# Patient Record
Sex: Female | Born: 1970 | Race: White | Hispanic: No | Marital: Married | Smoking: Never smoker
Health system: Southern US, Community
[De-identification: ages and names within clinical notes are randomized; demographics above are authoritative.]

## PROBLEM LIST (undated history)

## (undated) DIAGNOSIS — F32A Depression, unspecified: Secondary | ICD-10-CM

## (undated) DIAGNOSIS — K219 Gastro-esophageal reflux disease without esophagitis: Secondary | ICD-10-CM

## (undated) DIAGNOSIS — F329 Major depressive disorder, single episode, unspecified: Secondary | ICD-10-CM

## (undated) DIAGNOSIS — J45909 Unspecified asthma, uncomplicated: Secondary | ICD-10-CM

## (undated) DIAGNOSIS — N83201 Unspecified ovarian cyst, right side: Secondary | ICD-10-CM

## (undated) DIAGNOSIS — M069 Rheumatoid arthritis, unspecified: Secondary | ICD-10-CM

## (undated) DIAGNOSIS — J302 Other seasonal allergic rhinitis: Secondary | ICD-10-CM

## (undated) DIAGNOSIS — T7840XA Allergy, unspecified, initial encounter: Secondary | ICD-10-CM

## (undated) DIAGNOSIS — N809 Endometriosis, unspecified: Secondary | ICD-10-CM

## (undated) DIAGNOSIS — N83202 Unspecified ovarian cyst, left side: Secondary | ICD-10-CM

## (undated) DIAGNOSIS — D219 Benign neoplasm of connective and other soft tissue, unspecified: Secondary | ICD-10-CM

## (undated) HISTORY — DX: Allergy, unspecified, initial encounter: T78.40XA

## (undated) HISTORY — PX: ABDOMINAL HYSTERECTOMY: SHX81

## (undated) HISTORY — PX: WISDOM TOOTH EXTRACTION: SHX21

## (undated) HISTORY — DX: Unspecified asthma, uncomplicated: J45.909

## (undated) HISTORY — DX: Depression, unspecified: F32.A

## (undated) HISTORY — DX: Unspecified ovarian cyst, right side: N83.201

## (undated) HISTORY — DX: Unspecified ovarian cyst, left side: N83.202

## (undated) HISTORY — DX: Benign neoplasm of connective and other soft tissue, unspecified: D21.9

## (undated) HISTORY — DX: Endometriosis, unspecified: N80.9

## (undated) HISTORY — DX: Major depressive disorder, single episode, unspecified: F32.9

## (undated) HISTORY — DX: Rheumatoid arthritis, unspecified: M06.9

---

## 1999-11-11 ENCOUNTER — Other Ambulatory Visit: Admission: RE | Admit: 1999-11-11 | Discharge: 1999-11-11 | Payer: Self-pay | Admitting: Gynecology

## 2000-11-15 ENCOUNTER — Other Ambulatory Visit: Admission: RE | Admit: 2000-11-15 | Discharge: 2000-11-15 | Payer: Self-pay | Admitting: Obstetrics and Gynecology

## 2002-01-18 ENCOUNTER — Other Ambulatory Visit: Admission: RE | Admit: 2002-01-18 | Discharge: 2002-01-18 | Payer: Self-pay | Admitting: *Deleted

## 2003-01-19 ENCOUNTER — Other Ambulatory Visit: Admission: RE | Admit: 2003-01-19 | Discharge: 2003-01-19 | Payer: Self-pay | Admitting: Gynecology

## 2005-03-17 ENCOUNTER — Other Ambulatory Visit: Admission: RE | Admit: 2005-03-17 | Discharge: 2005-03-17 | Payer: Self-pay | Admitting: Gynecology

## 2012-11-08 ENCOUNTER — Other Ambulatory Visit: Payer: Self-pay | Admitting: Family Medicine

## 2012-11-08 DIAGNOSIS — R51 Headache: Secondary | ICD-10-CM

## 2012-11-09 ENCOUNTER — Ambulatory Visit
Admission: RE | Admit: 2012-11-09 | Discharge: 2012-11-09 | Disposition: A | Discharge status: Home / Self Care | Payer: BC Managed Care – PPO | Source: Ambulatory Visit | Attending: Family Medicine | Admitting: Family Medicine

## 2012-11-09 DIAGNOSIS — R51 Headache: Secondary | ICD-10-CM

## 2013-02-24 ENCOUNTER — Other Ambulatory Visit (HOSPITAL_COMMUNITY)
Admission: RE | Admit: 2013-02-24 | Discharge: 2013-02-24 | Disposition: A | Discharge status: Home / Self Care | Payer: BC Managed Care – PPO | Source: Ambulatory Visit | Attending: Family Medicine | Admitting: Family Medicine

## 2013-02-24 ENCOUNTER — Other Ambulatory Visit: Payer: Self-pay | Admitting: Family Medicine

## 2013-02-24 DIAGNOSIS — Z124 Encounter for screening for malignant neoplasm of cervix: Secondary | ICD-10-CM | POA: Insufficient documentation

## 2013-02-24 DIAGNOSIS — Z1151 Encounter for screening for human papillomavirus (HPV): Secondary | ICD-10-CM | POA: Insufficient documentation

## 2013-06-12 ENCOUNTER — Other Ambulatory Visit: Payer: Self-pay | Admitting: Family Medicine

## 2013-06-12 ENCOUNTER — Ambulatory Visit
Admission: RE | Admit: 2013-06-12 | Discharge: 2013-06-12 | Disposition: A | Discharge status: Home / Self Care | Payer: BC Managed Care – PPO | Source: Ambulatory Visit | Attending: Family Medicine | Admitting: Family Medicine

## 2013-06-12 DIAGNOSIS — M79609 Pain in unspecified limb: Secondary | ICD-10-CM

## 2013-06-22 ENCOUNTER — Encounter (HOSPITAL_COMMUNITY): Payer: Self-pay | Admitting: Emergency Medicine

## 2013-06-22 ENCOUNTER — Emergency Department (HOSPITAL_COMMUNITY)
Admission: EM | Admit: 2013-06-22 | Discharge: 2013-06-23 | Disposition: A | Discharge status: Home / Self Care | Payer: BC Managed Care – PPO | Attending: Emergency Medicine | Admitting: Emergency Medicine

## 2013-06-22 ENCOUNTER — Emergency Department (HOSPITAL_COMMUNITY): Payer: BC Managed Care – PPO

## 2013-06-22 DIAGNOSIS — Z3202 Encounter for pregnancy test, result negative: Secondary | ICD-10-CM | POA: Insufficient documentation

## 2013-06-22 DIAGNOSIS — R102 Pelvic and perineal pain: Secondary | ICD-10-CM

## 2013-06-22 DIAGNOSIS — R110 Nausea: Secondary | ICD-10-CM | POA: Insufficient documentation

## 2013-06-22 DIAGNOSIS — N83209 Unspecified ovarian cyst, unspecified side: Secondary | ICD-10-CM

## 2013-06-22 DIAGNOSIS — N83339 Acquired atrophy of ovary and fallopian tube, unspecified side: Secondary | ICD-10-CM | POA: Insufficient documentation

## 2013-06-22 DIAGNOSIS — N949 Unspecified condition associated with female genital organs and menstrual cycle: Secondary | ICD-10-CM | POA: Insufficient documentation

## 2013-06-22 DIAGNOSIS — Z79899 Other long term (current) drug therapy: Secondary | ICD-10-CM | POA: Insufficient documentation

## 2013-06-22 LAB — URINALYSIS, ROUTINE W REFLEX MICROSCOPIC
Bilirubin Urine: NEGATIVE
Glucose, UA: NEGATIVE mg/dL
Specific Gravity, Urine: 1.016 (ref 1.005–1.030)

## 2013-06-22 LAB — URINE MICROSCOPIC-ADD ON

## 2013-06-22 LAB — WET PREP, GENITAL: Yeast Wet Prep HPF POC: NONE SEEN

## 2013-06-22 MED ORDER — MORPHINE SULFATE 4 MG/ML IJ SOLN
4.0000 mg | Freq: Once | INTRAMUSCULAR | Status: AC
  Administered 2013-06-22: 4 mg via INTRAVENOUS
  Filled 2013-06-22: qty 1

## 2013-06-22 MED ORDER — HYDROCODONE-ACETAMINOPHEN 5-325 MG PO TABS: 1.0000 | ORAL_TABLET | ORAL | Status: DC | PRN

## 2013-06-22 MED ORDER — ONDANSETRON HCL 4 MG/2ML IJ SOLN
4.0000 mg | Freq: Once | INTRAMUSCULAR | Status: AC
  Administered 2013-06-22: 4 mg via INTRAVENOUS
  Filled 2013-06-22: qty 2

## 2013-06-22 NOTE — ED Notes (Signed)
US at bedside

## 2013-06-22 NOTE — ED Provider Notes (Signed)
CSN: [id number]     Arrival date & time 06/22/13  1851 History   First MD Initiated Contact with Patient 06/22/13 1958     Chief Complaint  Patient presents with  . Ovarian Cyst  . ovarian pain    (Consider location/radiation/quality/duration/timing/severity/associated sxs/prior Treatment) HPI Comments: Patient is an otherwise healthy 42 year old female presents to the emergency department for pelvic pain. Patient states she had an ultrasound on Monday for onset of pelvic pain she was diagnosed with multiple ovarian fibroids and an ovarian cyst on her right ovary. Patient also notes that they could not . Patient states she has had worsening pain since Monday. She notes the pain is more severe on the left side and describes it as sharp waxing and waning in nature. She states lying still alleviates the pain. She rates her pain 7/10. Denies any aggravating factors. Patient states she feels nauseous but denies any vomiting, vaginal bleeding or discharge, diarrhea. Patient states her last menstrual cycle was 3 weeks ago.   History reviewed. No pertinent past medical history. History reviewed. No pertinent past surgical history. No family history on file. History  Substance Use Topics  . Smoking status: Never Smoker   . Smokeless tobacco: Not on file  . Alcohol Use: Yes     Comment: ocassional   OB History   Grav Para Term Preterm Abortions TAB SAB Ect Mult Living                 Review of Systems  Constitutional: Negative for fever.  Gastrointestinal: Positive for nausea. Negative for vomiting and diarrhea.  Genitourinary: Positive for pelvic pain. Negative for vaginal bleeding and vaginal discharge.  All other systems reviewed and are negative.    Allergies  Sulfa antibiotics  Home Medications   Current Outpatient Rx  Name  Route  Sig  Dispense  Refill  . albuterol (PROVENTIL HFA;VENTOLIN HFA) 108 (90 BASE) MCG/ACT inhaler   Inhalation   Inhale 2 puffs into the lungs every 6  (six) hours as needed for wheezing.         . cetirizine (ZYRTEC) 10 MG tablet   Oral   Take 10 mg by mouth daily.         . cyclobenzaprine (FLEXERIL) 10 MG tablet   Oral   Take 10 mg by mouth 3 (three) times daily as needed for muscle spasms.         . fluticasone (FLONASE) 50 MCG/ACT nasal spray   Nasal   Place 2 sprays into the nose daily.         . sertraline (ZOLOFT) 100 MG tablet   Oral   Take 100 mg by mouth every morning.         Marland Kitchen HYDROcodone-acetaminophen (NORCO/VICODIN) 5-325 MG per tablet   Oral   Take 1 tablet by mouth every 4 (four) hours as needed for pain.   12 tablet   0    BP 129/61  Pulse 87  Temp(Src) 98.6 F (37 C) (Oral)  Resp 20  SpO2 99%  LMP 06/08/2013 Physical Exam  Constitutional: She is oriented to person, place, and time. She appears well-developed and well-nourished. No distress.  HENT:  Head: Normocephalic and atraumatic.  Right Ear: External ear normal.  Left Ear: External ear normal.  Nose: Nose normal.  Mouth/Throat: Oropharynx is clear and moist.  Eyes: Conjunctivae are normal.  Neck: Neck supple.  Cardiovascular: Normal rate, regular rhythm, normal heart sounds and intact distal pulses.   Pulmonary/Chest: Effort  normal and breath sounds normal.  Abdominal: Soft. Bowel sounds are normal. She exhibits no distension. There is no tenderness. There is no rebound and no guarding.  Musculoskeletal: She exhibits no edema and no tenderness.  Neurological: She is alert and oriented to person, place, and time.  Skin: Skin is warm and dry. She is not diaphoretic.   Exam performed by Francee Piccolo L,  exam chaperoned Date: 06/22/2013 Pelvic exam: normal external genitalia without evidence of trauma. VULVA: normal appearing vulva with no masses, tenderness or lesion. VAGINA: normal appearing vagina with normal color and discharge, no lesions. CERVIX: normal appearing cervix without lesions, cervical motion tenderness  absent, cervical os closed with out purulent discharge; vaginal discharge - clear, Wet prep and DNA probe for chlamydia and GC obtained.   ADNEXA: normal adnexa in size, tender and no masses UTERUS: uterus is normal size, shape, consistency and nontender.    ED Course  Procedures (including critical care time) Labs Review Labs Reviewed  WET PREP, GENITAL - Abnormal; Notable for the following:    WBC, Wet Prep HPF POC RARE (*)    All other components within normal limits  URINALYSIS, ROUTINE W REFLEX MICROSCOPIC - Abnormal; Notable for the following:    Hgb urine dipstick TRACE (*)    All other components within normal limits  URINE MICROSCOPIC-ADD ON - Abnormal; Notable for the following:    Bacteria, UA FEW (*)    All other components within normal limits  GC/CHLAMYDIA PROBE AMP  PREGNANCY, URINE   Imaging Review US Transvaginal Non-ob  06/22/2013   CLINICAL DATA:  Pelvic pain. Evaluate for potential ovarian torsion.  EXAM: TRANSABDOMINAL AND TRANSVAGINAL ULTRASOUND OF PELVIS  DOPPLER ULTRASOUND OF OVARIES  TECHNIQUE: Both transabdominal and transvaginal ultrasound examinations of the pelvis were performed. Transabdominal technique was performed for global imaging of the pelvis including uterus, ovaries, adnexal regions, and pelvic cul-de-sac.  It was necessary to proceed with endovaginal exam following the transabdominal exam to visualize the ovaries. Color and duplex Doppler ultrasound was utilized to evaluate blood flow to the ovaries.  COMPARISON:  06/19/2013.  FINDINGS: Uterus  Measurements: 9.9 x 5.5 x 6.6 cm. Multiple small heterogeneously isoechoic to hypoechoic lesions, compatible with multifocal fibroids. The largest of these is in the anterior aspect of the uterine body on the left side measuring 4.7 x 3.1 x 4.4 cm. Several of these lesions have internal areas that are very echogenic with posterior acoustic shadowing, likely to represent internal calcifications within the fibroids.   Endometrium  Endometrium is completely obscured by fibroids.  Right ovary  Measurements: 11.7 x 5.9 x 7.6 cm. Larger lesion measuring approximately 9.3 x 4.9 x 6.4 cm which is anechoic with increased through transmission, compatible with a cyst. It is uncertain whether not there is mural nodularity, or if this is simply just compression of the cyst wall and adjacent normal ovarian tissue.  Left ovary  Measurements: 4.9 x 3.1 x 3.9 cm. 3.3 x 2.9 x 2.9 cm anechoic lesion with increased through transmission, compatible with a simple cyst.  Pulsed Doppler evaluation of both ovaries demonstrates normal low-resistance arterial and venous waveforms.  IMPRESSION: 1. No findings to suggest ovarian torsion. 2. Fibroid uterus. 3. Large cyst in the right ovary measuring 9.3 x 4.9 x 6.4 cm. This generally appears simple, however, there is at least one area of potential mural nodularity (favored to represent some adjacent normal ovarian tissue). However, given the size of this cyst and potential complexity, further evaluation with  nonemergent pelvic MRI is recommended at this time to exclude potential neoplasm.   Electronically Signed   By: Trudie Reed M.D.   On: 06/22/2013 22:05   US Pelvis Complete  06/22/2013   CLINICAL DATA:  Pelvic pain. Evaluate for potential ovarian torsion.  EXAM: TRANSABDOMINAL AND TRANSVAGINAL ULTRASOUND OF PELVIS  DOPPLER ULTRASOUND OF OVARIES  TECHNIQUE: Both transabdominal and transvaginal ultrasound examinations of the pelvis were performed. Transabdominal technique was performed for global imaging of the pelvis including uterus, ovaries, adnexal regions, and pelvic cul-de-sac.  It was necessary to proceed with endovaginal exam following the transabdominal exam to visualize the ovaries. Color and duplex Doppler ultrasound was utilized to evaluate blood flow to the ovaries.  COMPARISON:  06/19/2013.  FINDINGS: Uterus  Measurements: 9.9 x 5.5 x 6.6 cm. Multiple small heterogeneously isoechoic  to hypoechoic lesions, compatible with multifocal fibroids. The largest of these is in the anterior aspect of the uterine body on the left side measuring 4.7 x 3.1 x 4.4 cm. Several of these lesions have internal areas that are very echogenic with posterior acoustic shadowing, likely to represent internal calcifications within the fibroids.  Endometrium  Endometrium is completely obscured by fibroids.  Right ovary  Measurements: 11.7 x 5.9 x 7.6 cm. Larger lesion measuring approximately 9.3 x 4.9 x 6.4 cm which is anechoic with increased through transmission, compatible with a cyst. It is uncertain whether not there is mural nodularity, or if this is simply just compression of the cyst wall and adjacent normal ovarian tissue.  Left ovary  Measurements: 4.9 x 3.1 x 3.9 cm. 3.3 x 2.9 x 2.9 cm anechoic lesion with increased through transmission, compatible with a simple cyst.  Pulsed Doppler evaluation of both ovaries demonstrates normal low-resistance arterial and venous waveforms.  IMPRESSION: 1. No findings to suggest ovarian torsion. 2. Fibroid uterus. 3. Large cyst in the right ovary measuring 9.3 x 4.9 x 6.4 cm. This generally appears simple, however, there is at least one area of potential mural nodularity (favored to represent some adjacent normal ovarian tissue). However, given the size of this cyst and potential complexity, further evaluation with nonemergent pelvic MRI is recommended at this time to exclude potential neoplasm.   Electronically Signed   By: Trudie Reed M.D.   On: 06/22/2013 22:05   [place] Art/ven Flow Abd Pelv Doppler  06/22/2013   CLINICAL DATA:  Pelvic pain. Evaluate for potential ovarian torsion.  EXAM: TRANSABDOMINAL AND TRANSVAGINAL ULTRASOUND OF PELVIS  DOPPLER ULTRASOUND OF OVARIES  TECHNIQUE: Both transabdominal and transvaginal ultrasound examinations of the pelvis were performed. Transabdominal technique was performed for global imaging of the pelvis including uterus, ovaries,  adnexal regions, and pelvic cul-de-sac.  It was necessary to proceed with endovaginal exam following the transabdominal exam to visualize the ovaries. Color and duplex Doppler ultrasound was utilized to evaluate blood flow to the ovaries.  COMPARISON:  06/19/2013.  FINDINGS: Uterus  Measurements: 9.9 x 5.5 x 6.6 cm. Multiple small heterogeneously isoechoic to hypoechoic lesions, compatible with multifocal fibroids. The largest of these is in the anterior aspect of the uterine body on the left side measuring 4.7 x 3.1 x 4.4 cm. Several of these lesions have internal areas that are very echogenic with posterior acoustic shadowing, likely to represent internal calcifications within the fibroids.  Endometrium  Endometrium is completely obscured by fibroids.  Right ovary  Measurements: 11.7 x 5.9 x 7.6 cm. Larger lesion measuring approximately 9.3 x 4.9 x 6.4 cm which is anechoic with increased  through transmission, compatible with a cyst. It is uncertain whether not there is mural nodularity, or if this is simply just compression of the cyst wall and adjacent normal ovarian tissue.  Left ovary  Measurements: 4.9 x 3.1 x 3.9 cm. 3.3 x 2.9 x 2.9 cm anechoic lesion with increased through transmission, compatible with a simple cyst.  Pulsed Doppler evaluation of both ovaries demonstrates normal low-resistance arterial and venous waveforms.  IMPRESSION: 1. No findings to suggest ovarian torsion. 2. Fibroid uterus. 3. Large cyst in the right ovary measuring 9.3 x 4.9 x 6.4 cm. This generally appears simple, however, there is at least one area of potential mural nodularity (favored to represent some adjacent normal ovarian tissue). However, given the size of this cyst and potential complexity, further evaluation with nonemergent pelvic MRI is recommended at this time to exclude potential neoplasm.   Electronically Signed   By: Trudie Reed M.D.   On: 06/22/2013 22:05    MDM   1. Ovarian cyst   2. Pelvic pain       Afebrile, NAD, non-toxic appearing, AAOx4. Abdomen soft nontender nondistended with normal bowel sounds. Pelvic exam revealed bilateral adnexal tenderness without cervical motion tenderness likely attributed to numerous fibroids in uterus along with large right ovarian cyst. I have reviewed nursing notes, vital signs, and all appropriate lab and imaging results for this patient. Discussed ultrasound findings with patient. Provided printout of radiology read of ultrasound. Discussed the need to followup with her primary care physician tomorrow to schedule followup MRI. Advised patient to move up her gynecology appointment sooner than October 1. Pain and symptoms managed in the ED. Return precautions discussed. Patient agreeable to plan. Pain medication indicated. Patient stable at time of discharge. Patient d/w with Dr. Fayrene Fearing, agrees with plan.        Jeannetta Ellis, PA-C 06/23/13 [phone]

## 2013-06-22 NOTE — ED Notes (Signed)
Pt states she has ovarian cyst and fibroids, had ultrasound on Monday. States pain has gotten worse over the week. Pt spoke to PCP and was told to come here.

## 2013-06-25 NOTE — ED Provider Notes (Signed)
Medical screening examination/treatment/procedure(s) were performed by non-physician practitioner and as supervising physician I was immediately available for consultation/collaboration.   Roney Marion, MD 06/25/13 [phone]

## 2013-06-26 ENCOUNTER — Encounter: Payer: Self-pay | Admitting: Gynecology

## 2013-06-26 ENCOUNTER — Ambulatory Visit (INDEPENDENT_AMBULATORY_CARE_PROVIDER_SITE_OTHER): Payer: BC Managed Care – PPO | Admitting: Gynecology

## 2013-06-26 VITALS — BP 134/80 | HR 88 | Resp 16 | Ht 68.0 in | Wt 253.0 lb

## 2013-06-26 DIAGNOSIS — D259 Leiomyoma of uterus, unspecified: Secondary | ICD-10-CM

## 2013-06-26 DIAGNOSIS — N83209 Unspecified ovarian cyst, unspecified side: Secondary | ICD-10-CM

## 2013-06-26 DIAGNOSIS — N83299 Other ovarian cyst, unspecified side: Secondary | ICD-10-CM | POA: Insufficient documentation

## 2013-06-26 DIAGNOSIS — R102 Pelvic and perineal pain: Secondary | ICD-10-CM

## 2013-06-26 DIAGNOSIS — N949 Unspecified condition associated with female genital organs and menstrual cycle: Secondary | ICD-10-CM

## 2013-06-26 MED ORDER — HYDROCODONE-ACETAMINOPHEN 5-325 MG PO TABS: 1.0000 | ORAL_TABLET | ORAL | Status: DC | PRN

## 2013-06-26 NOTE — Progress Notes (Signed)
42 y.o. Partnered Caucasian female   G0P0000 here for annual exam. Pt is currently sexually active.  Pt reports pelvic pain for 1-2y but had gotten much wrose over the last 21m.  described as dull ache and pressure-abdominal.  Pt denies weight loss.  Pt had u/s done after PMD noted that her uterus felt an enlarged uterus, pt had u/s done 8/16 and was noted to have multiple fibroids and bilateral ovarian cysts, one complex, no torsion.  Pt had not had a pelvic exam in 7y due to loss of insurance.  No pain with vaginal penetration  Patient's last menstrual period was 06/04/2013.          Sexually active: no  The current method of family planning is not indicated  Exercising: yes  The patient does not participate in regular exercise at present. Last pap: 4/14- Negative Alcohol: occasionally  Tobacco: no BSE: no Mammogram:  none  Hgb: PCP ; Urine: PCP  No health maintenance topics applied.  Family History  Problem Relation Age of Onset  . Hypertension Mother   . Diabetes Father     Type 2  . Osteoporosis Maternal Grandmother   . Colon cancer Paternal Grandfather     There are no active problems to display for this patient.   Past Medical History  Diagnosis Date  . Depression   . Fibroid     Past Surgical History  Procedure Laterality Date  . Wisdom tooth extraction  30 years ago    Allergies: Sulfa antibiotics  Current Outpatient Prescriptions  Medication Sig Dispense Refill  . cetirizine (ZYRTEC) 10 MG tablet Take 10 mg by mouth daily.      . cyclobenzaprine (FLEXERIL) 10 MG tablet Take 10 mg by mouth 3 (three) times daily as needed for muscle spasms.      . fluticasone (FLONASE) 50 MCG/ACT nasal spray Place 2 sprays into the nose daily.      Marland Kitchen HYDROcodone-acetaminophen (NORCO/VICODIN) 5-325 MG per tablet Take 1 tablet by mouth every 4 (four) hours as needed for pain.  12 tablet  0  . sertraline (ZOLOFT) 100 MG tablet Take 100 mg by mouth every morning.      Marland Kitchen albuterol  (PROVENTIL HFA;VENTOLIN HFA) 108 (90 BASE) MCG/ACT inhaler Inhale 2 puffs into the lungs every 6 (six) hours as needed for wheezing.       No current facility-administered medications for this visit.    ROS: Pertinent items are noted in HPI.  Exam:    Ht 5\' 8"  (1.727 m)  Wt 253 lb (114.76 kg)  BMI 38.48 kg/m2  LMP 06/04/2013 Weight change: @WEIGHTCHANGE @ Last 3 height recordings:  Ht Readings from Last 3 Encounters:  06/26/13 5\' 8"  (1.727 m)   General appearance: alert, cooperative and appears stated age Head: Normocephalic, without obvious abnormality, atraumatic Neck: no adenopathy, no carotid bruit, no JVD, supple, symmetrical, trachea midline and thyroid not enlarged, symmetric, no tenderness/mass/nodules Lungs: clear to auscultation bilaterally Breasts: normal appearance, no masses or tenderness Heart: regular rate and rhythm, S1, S2 normal, no murmur, click, rub or gallop Abdomen: soft, non-tender; bowel sounds normal; no masses,  no organomegaly Extremities: extremities normal, atraumatic, no cyanosis or edema Skin: Skin color, texture, turgor normal. No rashes or lesions Lymph nodes: Cervical, supraclavicular, and axillary nodes normal. no inguinal nodes palpated Neurologic: Grossly normal    Pelvic: External genitalia:  no lesions              Urethra: normal appearing urethra with no  masses, tenderness or lesions              Bartholins and Skenes: Bartholin's, Urethra, Skene's normal                 Vagina: normal appearing vagina with normal color and discharge, no lesions              Cervix: normal appearance              Pap taken: no        Bimanual Exam:  Uterus:  uterus is normal size, shape, consistency and nontender, enlarged to 10 week's size, irregular                                      Adnexa:    Gentle exam due to known bilateral complex masses                                      Rectovaginal: Confirms, minimal rectal mass                                       Anus:  normal sphincter tone, no lesions  A: fibroid uterus Complex adnexal mass Pain management mammogram     P: reviewed images of u/s will get ca125 Will refill vicoden for now Pt will get screening mammogram    An After Visit Summary was printed and given to the patient.

## 2013-06-26 NOTE — Patient Instructions (Signed)
Start colace 100mg  3x/d

## 2013-06-27 ENCOUNTER — Other Ambulatory Visit: Payer: Self-pay

## 2013-06-27 ENCOUNTER — Other Ambulatory Visit: Payer: Self-pay | Admitting: Gynecology

## 2013-06-27 DIAGNOSIS — Z1231 Encounter for screening mammogram for malignant neoplasm of breast: Secondary | ICD-10-CM

## 2013-06-28 ENCOUNTER — Telehealth: Payer: Self-pay | Admitting: *Deleted

## 2013-06-28 ENCOUNTER — Ambulatory Visit
Admission: RE | Admit: 2013-06-28 | Discharge: 2013-06-28 | Disposition: A | Discharge status: Home / Self Care | Payer: Self-pay | Source: Ambulatory Visit

## 2013-06-28 DIAGNOSIS — Z1231 Encounter for screening mammogram for malignant neoplasm of breast: Secondary | ICD-10-CM

## 2013-06-28 NOTE — Telephone Encounter (Signed)
Message copied by Alisa Graff on Wed Jun 28, 2013  5:05 PM ------      Message from: Douglass Rivers      Created: Wed Jun 28, 2013  2:55 PM       Please inform minimally elevated which is consistent with this being endometriomas ------

## 2013-06-28 NOTE — Telephone Encounter (Signed)
Patient notified of results as Dr Farrel Gobble instructed.

## 2013-06-29 ENCOUNTER — Telehealth: Payer: Self-pay | Admitting: Gynecology

## 2013-06-29 NOTE — Telephone Encounter (Signed)
Spoke with patient in regards to her OOP for surgery. Explained that we are filing through her insurance company first then she will receive a bill. **We are holding charges until the end of Oct. [facility] fees should fulfill patients OOP maximum.

## 2013-06-29 NOTE — Telephone Encounter (Signed)
Patient is asking for OOP cost for upcoming surgery.

## 2013-06-30 ENCOUNTER — Encounter (HOSPITAL_COMMUNITY): Payer: Self-pay | Admitting: Pharmacist

## 2013-07-03 ENCOUNTER — Other Ambulatory Visit: Payer: Self-pay

## 2013-07-03 ENCOUNTER — Encounter (HOSPITAL_COMMUNITY)
Admission: RE | Admit: 2013-07-03 | Discharge: 2013-07-03 | Disposition: A | Discharge status: Home / Self Care | Payer: BC Managed Care – PPO | Source: Ambulatory Visit | Attending: Gynecology | Admitting: Gynecology

## 2013-07-03 ENCOUNTER — Other Ambulatory Visit: Payer: Self-pay | Admitting: Gynecology

## 2013-07-03 ENCOUNTER — Encounter (HOSPITAL_COMMUNITY): Payer: Self-pay

## 2013-07-03 DIAGNOSIS — Z01818 Encounter for other preprocedural examination: Secondary | ICD-10-CM | POA: Insufficient documentation

## 2013-07-03 DIAGNOSIS — R928 Other abnormal and inconclusive findings on diagnostic imaging of breast: Secondary | ICD-10-CM

## 2013-07-03 DIAGNOSIS — Z01812 Encounter for preprocedural laboratory examination: Secondary | ICD-10-CM | POA: Insufficient documentation

## 2013-07-03 DIAGNOSIS — N63 Unspecified lump in unspecified breast: Secondary | ICD-10-CM

## 2013-07-03 HISTORY — DX: Other seasonal allergic rhinitis: J30.2

## 2013-07-03 HISTORY — DX: Gastro-esophageal reflux disease without esophagitis: K21.9

## 2013-07-03 LAB — CBC
HCT: 41.2 % (ref 36.0–46.0)
Hemoglobin: 14.1 g/dL (ref 12.0–15.0)
MCV: 85.5 fL (ref 78.0–100.0)
Platelets: 280 10*3/uL (ref 150–400)
RBC: 4.82 MIL/uL (ref 3.87–5.11)
WBC: 10.8 10*3/uL — ABNORMAL HIGH (ref 4.0–10.5)

## 2013-07-03 NOTE — Patient Instructions (Addendum)
   Your procedure is scheduled on: Monday, Oct. 13  Enter through the Main Entrance of [facility] at: 6 am Pick up the phone at the desk and dial [phone] and inform us of your arrival.  Please call this number if you have any problems the morning of surgery: [phone]  Remember: Do not eat or drink after midnight: Sunday Take these medicines the morning of surgery with a SIP OF WATER:  Zyrtec, zoloft, and bring albuterol inhaler   Do not wear jewelry, make-up, or FINGER nail polish No metal in your hair or on your body. Do not wear lotions, powders, perfumes. You may wear deodorant.  Please use your CHG wash as directed prior to surgery.  Do not shave anywhere for at least 12 hours prior to first CHG shower.  Do not bring valuables to the hospital. Contacts, dentures or bridgework may not be worn into surgery.  Leave suitcase in the car. After Surgery it may be brought to your room. For patients being admitted to the hospital, checkout time is 11:00am the day of discharge.  Patients discharged on the day of surgery will not be allowed to drive home.  Home with mother Rendy Lazard or [facility].

## 2013-07-04 ENCOUNTER — Telehealth: Payer: Self-pay | Admitting: Orthopedic Surgery

## 2013-07-04 ENCOUNTER — Telehealth: Payer: Self-pay | Admitting: Gynecology

## 2013-07-04 NOTE — Telephone Encounter (Signed)
Spoke with pt re: pre op instructions per sheet. Scheduled pre op and both post op appts with TL. Mailed instruction sheet to pt. Questions answered.

## 2013-07-05 ENCOUNTER — Encounter: Payer: Self-pay | Admitting: Gynecology

## 2013-07-05 ENCOUNTER — Ambulatory Visit (INDEPENDENT_AMBULATORY_CARE_PROVIDER_SITE_OTHER): Payer: BC Managed Care – PPO | Admitting: Gynecology

## 2013-07-05 VITALS — BP 112/62 | HR 68 | Resp 12 | Ht 68.0 in | Wt 254.0 lb

## 2013-07-05 DIAGNOSIS — N83299 Other ovarian cyst, unspecified side: Secondary | ICD-10-CM

## 2013-07-05 DIAGNOSIS — D259 Leiomyoma of uterus, unspecified: Secondary | ICD-10-CM

## 2013-07-05 DIAGNOSIS — N83209 Unspecified ovarian cyst, unspecified side: Secondary | ICD-10-CM

## 2013-07-05 MED ORDER — CELECOXIB 200 MG PO CAPS: ORAL_CAPSULE | ORAL | Status: DC

## 2013-07-05 MED ORDER — HYDROCODONE-ACETAMINOPHEN 5-325 MG PO TABS: 1.0000 | ORAL_TABLET | ORAL | Status: DC | PRN

## 2013-07-05 MED ORDER — HYDROMORPHONE HCL 2 MG PO TABS: 2.0000 mg | ORAL_TABLET | ORAL | Status: DC | PRN

## 2013-07-05 NOTE — Patient Instructions (Signed)
Take 2 tablets 400mg - celebrex the morning of OR then take one every 12h for the next 3d May mix with dilaudid 1-2 every 4h as needed for pain

## 2013-07-05 NOTE — Progress Notes (Signed)
42 y.o.SingleCaucasian G0P0000 female here for consideration for robotically assisted TLH  with Bilateral salpingectomy and with removal of ovaries.    She complains of uterine leiomyomata/fibroids and bilateral endometriomas. PUS on 06/22/13 showed uterus 9.9 x 5.5 x 6.6 cm. Multiple small heterogeneously  isoechoic to hypoechoic lesions, compatible with multifocal  fibroids. The largest of these is in the anterior aspect of the  uterine body on the left side measuring 4.7 x 3.1 x 4.4 cm..  Large cyst in the right ovary measuring 9.3 x 4.9 x 6.4 cm. This generally appears simple, however, there is at least one area of potential mural nodularity    The current method of family planning is none.     Hormones:none   reports that she has never smoked. She has never used smokeless tobacco. She reports that  drinks alcohol. She reports that she does not use illicit drugs.  @CHLEC1NM @  Health Maintenance  Topic Date Due  . Tetanus/tdap  04/16/1988  . Influenza Vaccine  05/05/2013  . Pap Smear  03/03/2016    Family History  Problem Relation Age of Onset  . Hypertension Mother   . Diabetes Father     Type 2  . Osteoporosis Maternal Grandmother   . Colon cancer Paternal Grandfather     Patient Active Problem List   Diagnosis Date Noted  . Complex ovarian cyst 06/26/2013  . Fibroid uterus 06/26/2013    Past Medical History  Diagnosis Date  . Depression   . Fibroid   . Ovarian cyst, bilateral   . Asthma     seasonal  . Seasonal allergies   . GERD (gastroesophageal reflux disease)     diet controlled and otc med prn    Past Surgical History  Procedure Laterality Date  . Wisdom tooth extraction  30 years ago    Allergies: @RRALLERGIES @  Current Outpatient Prescriptions  Medication Sig Dispense Refill  . albuterol (PROVENTIL HFA;VENTOLIN HFA) 108 (90 BASE) MCG/ACT inhaler Inhale 2 puffs into the lungs every 6 (six) hours as needed for wheezing.      . cetirizine (ZYRTEC) 10  MG tablet Take 10 mg by mouth daily.      . fluticasone (FLONASE) 50 MCG/ACT nasal spray Place 2 sprays into the nose daily.      Marland Kitchen HYDROcodone-acetaminophen (NORCO/VICODIN) 5-325 MG per tablet Take 1 tablet by mouth every 4 (four) hours as needed for pain.  20 tablet  0  . sertraline (ZOLOFT) 100 MG tablet Take 100 mg by mouth every morning.      . celecoxib (CELEBREX) 200 MG capsule Take 2 tablets am of surgery then 1 by mouth twice a day  6 capsule  0  . HYDROmorphone (DILAUDID) 2 MG tablet Take 1 tablet (2 mg total) by mouth every 4 (four) hours as needed for pain.  20 tablet  0   No current facility-administered medications for this visit.      Exam:    BP 112/62  Pulse 68  Resp 12  Ht 5\' 8"  (1.727 m)  Wt 254 lb (115.214 kg)  BMI 38.63 kg/m2  LMP 07/01/2013  General appearance: alert, cooperative and appears stated age Head: Normocephalic, without obvious abnormality, atraumatic Neck: no adenopathy, no carotid bruit, no JVD, supple, symmetrical, trachea midline and thyroid not enlarged, symmetric, no tenderness/mass/nodules Lungs: clear to auscultation bilaterally Heart: regular rate and rhythm, S1, S2 normal, no murmur, click, rub or gallop Abdomen: soft, non-tender; bowel sounds normal; no masses,  no organomegaly Extremities:  extremities normal, atraumatic, no cyanosis or edema Lymph nodes: Cervical, supraclavicular, and axillary nodes normal. no inguinal nodes palpated Neurologic: Grossly normal    Pelvic: deferred                    A/P:  The planned procedure was discussed with the patient.  Pre-op instructions, hospital course, and post-op instructions reviewed.  Post op instruction booklet given and reviewed.  Risks and possible complications discussed, including but not limited to, bleeding and possible transfusion; infection; anesthesia complications; injury to visceral organ requiring further surgery, either immediately or delayed; wound complications including  infection, blood collections, and bowel herniation; allergic reactions; swelling of the face due to prolonged positioning;  VTE or air emboli; nerve injury related to prolonged positioning; even death.    The patient was given the opportunity to watch the informed consent video on hysterectomy.  Her questions were invited and answered.  The patient states she understands the risks and possible complications, and wishes to proceed as planned.  Consent form signed.  She was instructed to take two 200 mg celebrex capsules with a small sip of water the morning of the procedure, and then to take one bid on POD 1 and 2.  Rx given for vicoden 5/325 for now and dilaudid 2mg  for afterwards to take one or two po q4-6 hours prn post op pain.    Ready for surgery.

## 2013-07-06 NOTE — Telephone Encounter (Signed)
Patient states pharmacy did not get rx for vicodin or dilaudid. Please advise?  [place] [employer]

## 2013-07-07 ENCOUNTER — Other Ambulatory Visit: Payer: Self-pay | Admitting: Gynecology

## 2013-07-07 ENCOUNTER — Other Ambulatory Visit: Payer: Self-pay | Admitting: Obstetrics and Gynecology

## 2013-07-07 DIAGNOSIS — R928 Other abnormal and inconclusive findings on diagnostic imaging of breast: Secondary | ICD-10-CM

## 2013-07-07 NOTE — Telephone Encounter (Signed)
S/w patient rx's were printed out but not given to her. Printed rx's are in a envelope and sent to the front; patient to come by and pick up today.

## 2013-07-14 ENCOUNTER — Ambulatory Visit
Admission: RE | Admit: 2013-07-14 | Discharge: 2013-07-14 | Disposition: A | Discharge status: Home / Self Care | Payer: BC Managed Care – PPO | Source: Ambulatory Visit | Attending: Gynecology | Admitting: Gynecology

## 2013-07-14 ENCOUNTER — Telehealth: Payer: Self-pay | Admitting: Gynecology

## 2013-07-14 DIAGNOSIS — R928 Other abnormal and inconclusive findings on diagnostic imaging of breast: Secondary | ICD-10-CM

## 2013-07-14 NOTE — Telephone Encounter (Signed)
Patient went to pick up medications for surgery Monday. They wouldn't fill the celebrex because they said it required doctor authorization or something.

## 2013-07-14 NOTE — Telephone Encounter (Signed)
Spoke with [employer]. They require prior authorization. Advised by bcbs Vernona Rieger P) it would take 72 hours for authorization to be completed.  Patient has surgery on Monday.  I spoke with patient and advised she could pay cash for rx (6 pills only) and use coupon from [email or web address]

## 2013-07-16 MED ORDER — DEXTROSE 5 % IV SOLN
2.0000 g | INTRAVENOUS | Status: AC
  Administered 2013-07-17: 2 g via INTRAVENOUS
  Filled 2013-07-16: qty 2

## 2013-07-17 ENCOUNTER — Encounter (HOSPITAL_COMMUNITY): Payer: BC Managed Care – PPO | Admitting: Anesthesiology

## 2013-07-17 ENCOUNTER — Ambulatory Visit (HOSPITAL_COMMUNITY)
Admission: RE | Admit: 2013-07-17 | Discharge: 2013-07-18 | Disposition: A | Discharge status: Home / Self Care | Payer: BC Managed Care – PPO | Source: Ambulatory Visit | Attending: Gynecology | Admitting: Gynecology

## 2013-07-17 ENCOUNTER — Encounter (HOSPITAL_COMMUNITY): Payer: Self-pay | Admitting: *Deleted

## 2013-07-17 ENCOUNTER — Encounter (HOSPITAL_COMMUNITY)
Admission: RE | Disposition: A | Discharge status: Home / Self Care | Payer: Self-pay | Source: Ambulatory Visit | Attending: Gynecology

## 2013-07-17 ENCOUNTER — Ambulatory Visit (HOSPITAL_COMMUNITY): Payer: BC Managed Care – PPO | Admitting: Anesthesiology

## 2013-07-17 DIAGNOSIS — D259 Leiomyoma of uterus, unspecified: Secondary | ICD-10-CM

## 2013-07-17 DIAGNOSIS — Z9889 Other specified postprocedural states: Secondary | ICD-10-CM

## 2013-07-17 DIAGNOSIS — N8000 Endometriosis of the uterus, unspecified: Secondary | ICD-10-CM

## 2013-07-17 DIAGNOSIS — N80109 Endometriosis of ovary, unspecified side, unspecified depth: Secondary | ICD-10-CM | POA: Insufficient documentation

## 2013-07-17 DIAGNOSIS — N803 Endometriosis of pelvic peritoneum, unspecified: Secondary | ICD-10-CM | POA: Insufficient documentation

## 2013-07-17 DIAGNOSIS — N949 Unspecified condition associated with female genital organs and menstrual cycle: Secondary | ICD-10-CM | POA: Insufficient documentation

## 2013-07-17 DIAGNOSIS — N8300 Follicular cyst of ovary, unspecified side: Secondary | ICD-10-CM | POA: Insufficient documentation

## 2013-07-17 DIAGNOSIS — N852 Hypertrophy of uterus: Secondary | ICD-10-CM | POA: Insufficient documentation

## 2013-07-17 DIAGNOSIS — N9489 Other specified conditions associated with female genital organs and menstrual cycle: Secondary | ICD-10-CM

## 2013-07-17 DIAGNOSIS — D251 Intramural leiomyoma of uterus: Secondary | ICD-10-CM | POA: Insufficient documentation

## 2013-07-17 DIAGNOSIS — N831 Corpus luteum cyst of ovary, unspecified side: Secondary | ICD-10-CM | POA: Insufficient documentation

## 2013-07-17 DIAGNOSIS — N801 Endometriosis of ovary: Secondary | ICD-10-CM | POA: Insufficient documentation

## 2013-07-17 HISTORY — PX: ROBOTIC ASSISTED TOTAL HYSTERECTOMY: SHX6085

## 2013-07-17 HISTORY — PX: ROBOTIC ASSISTED BILATERAL SALPINGO OOPHERECTOMY: SHX6078

## 2013-07-17 HISTORY — PX: CYSTOSCOPY: SHX5120

## 2013-07-17 LAB — TYPE AND SCREEN: ABO/RH(D): O NEG

## 2013-07-17 SURGERY — ROBOTIC ASSISTED TOTAL HYSTERECTOMY
Anesthesia: General | Site: Bladder | Wound class: Clean Contaminated

## 2013-07-17 MED ORDER — LIDOCAINE HCL (CARDIAC) 20 MG/ML IV SOLN
INTRAVENOUS | Status: AC
  Filled 2013-07-17: qty 5

## 2013-07-17 MED ORDER — ROCURONIUM BROMIDE 100 MG/10ML IV SOLN
INTRAVENOUS | Status: DC | PRN
  Administered 2013-07-17: 20 mg via INTRAVENOUS
  Administered 2013-07-17: 10 mg via INTRAVENOUS
  Administered 2013-07-17: 60 mg via INTRAVENOUS

## 2013-07-17 MED ORDER — PROPOFOL 10 MG/ML IV BOLUS
INTRAVENOUS | Status: DC | PRN
  Administered 2013-07-17: 200 mg via INTRAVENOUS

## 2013-07-17 MED ORDER — LACTATED RINGERS IV SOLN: INTRAVENOUS | Status: DC

## 2013-07-17 MED ORDER — HYDROMORPHONE HCL PF 1 MG/ML IJ SOLN
INTRAMUSCULAR | Status: AC
  Filled 2013-07-17: qty 1

## 2013-07-17 MED ORDER — NEOSTIGMINE METHYLSULFATE 1 MG/ML IJ SOLN
INTRAMUSCULAR | Status: DC | PRN
  Administered 2013-07-17: 4 mg via INTRAVENOUS

## 2013-07-17 MED ORDER — DOCUSATE SODIUM 100 MG PO CAPS
100.0000 mg | ORAL_CAPSULE | Freq: Two times a day (BID) | ORAL | Status: DC
  Administered 2013-07-17 – 2013-07-18 (×2): 100 mg via ORAL
  Filled 2013-07-17 (×2): qty 1

## 2013-07-17 MED ORDER — MEPERIDINE HCL 25 MG/ML IJ SOLN: 6.2500 mg | INTRAMUSCULAR | Status: DC | PRN

## 2013-07-17 MED ORDER — ONDANSETRON HCL 4 MG/2ML IJ SOLN
INTRAMUSCULAR | Status: DC | PRN
  Administered 2013-07-17: 4 mg via INTRAMUSCULAR

## 2013-07-17 MED ORDER — FLUTICASONE PROPIONATE 50 MCG/ACT NA SUSP
2.0000 | Freq: Every day | NASAL | Status: DC
  Administered 2013-07-17: 2 via NASAL
  Filled 2013-07-17 (×2): qty 16

## 2013-07-17 MED ORDER — GLYCOPYRROLATE 0.2 MG/ML IJ SOLN
INTRAMUSCULAR | Status: DC | PRN
  Administered 2013-07-17: 0.6 mg via INTRAVENOUS

## 2013-07-17 MED ORDER — ONDANSETRON HCL 4 MG/2ML IJ SOLN: 4.0000 mg | Freq: Four times a day (QID) | INTRAMUSCULAR | Status: DC | PRN

## 2013-07-17 MED ORDER — ALBUTEROL SULFATE HFA 108 (90 BASE) MCG/ACT IN AERS
INHALATION_SPRAY | RESPIRATORY_TRACT | Status: DC | PRN
  Administered 2013-07-17 (×2): 2 via RESPIRATORY_TRACT

## 2013-07-17 MED ORDER — LACTATED RINGERS IV SOLN
INTRAVENOUS | Status: DC
  Administered 2013-07-17 – 2013-07-18 (×2): via INTRAVENOUS

## 2013-07-17 MED ORDER — MIDAZOLAM HCL 2 MG/2ML IJ SOLN
INTRAMUSCULAR | Status: AC
  Filled 2013-07-17: qty 2

## 2013-07-17 MED ORDER — SODIUM CHLORIDE 0.9 % IJ SOLN
INTRAMUSCULAR | Status: AC
  Filled 2013-07-17: qty 10

## 2013-07-17 MED ORDER — SODIUM CHLORIDE 0.9 % IJ SOLN
INTRAMUSCULAR | Status: DC | PRN
  Administered 2013-07-17: 60 mL via INTRAVENOUS

## 2013-07-17 MED ORDER — FENTANYL CITRATE 0.05 MG/ML IJ SOLN
INTRAMUSCULAR | Status: AC
  Filled 2013-07-17: qty 2

## 2013-07-17 MED ORDER — HYDROMORPHONE HCL PF 1 MG/ML IJ SOLN
0.2000 mg | INTRAMUSCULAR | Status: DC | PRN
  Administered 2013-07-17 – 2013-07-18 (×4): 0.6 mg via INTRAVENOUS
  Filled 2013-07-17 (×5): qty 1

## 2013-07-17 MED ORDER — INFLUENZA VAC SPLIT QUAD 0.5 ML IM SUSP
0.5000 mL | INTRAMUSCULAR | Status: AC
  Administered 2013-07-18: 0.5 mL via INTRAMUSCULAR
  Filled 2013-07-17: qty 0.5

## 2013-07-17 MED ORDER — ROPIVACAINE HCL 5 MG/ML IJ SOLN
INTRAMUSCULAR | Status: DC | PRN
  Administered 2013-07-17: 5 mL via EPIDURAL

## 2013-07-17 MED ORDER — ONDANSETRON HCL 4 MG/2ML IJ SOLN
INTRAMUSCULAR | Status: AC
  Filled 2013-07-17: qty 2

## 2013-07-17 MED ORDER — GLYCOPYRROLATE 0.2 MG/ML IJ SOLN
INTRAMUSCULAR | Status: AC
  Filled 2013-07-17: qty 3

## 2013-07-17 MED ORDER — HYDROMORPHONE HCL PF 1 MG/ML IJ SOLN
INTRAMUSCULAR | Status: AC
  Administered 2013-07-17: 0.5 mg via INTRAVENOUS
  Filled 2013-07-17: qty 1

## 2013-07-17 MED ORDER — PROPOFOL 10 MG/ML IV EMUL
INTRAVENOUS | Status: AC
  Filled 2013-07-17: qty 20

## 2013-07-17 MED ORDER — ESTRADIOL 0.1 MG/24HR TD PTWK
0.1000 mg | MEDICATED_PATCH | TRANSDERMAL | Status: DC
  Administered 2013-07-17: 0.1 mg via TRANSDERMAL
  Filled 2013-07-17: qty 1

## 2013-07-17 MED ORDER — ROPIVACAINE HCL 5 MG/ML IJ SOLN
INTRAMUSCULAR | Status: AC
  Filled 2013-07-17: qty 30

## 2013-07-17 MED ORDER — ALBUTEROL SULFATE HFA 108 (90 BASE) MCG/ACT IN AERS
2.0000 | INHALATION_SPRAY | Freq: Four times a day (QID) | RESPIRATORY_TRACT | Status: DC | PRN
  Filled 2013-07-17: qty 6.7

## 2013-07-17 MED ORDER — HEPARIN SODIUM (PORCINE) 5000 UNIT/ML IJ SOLN
INTRAMUSCULAR | Status: AC
  Filled 2013-07-17: qty 1

## 2013-07-17 MED ORDER — HYDROMORPHONE HCL PF 1 MG/ML IJ SOLN
INTRAMUSCULAR | Status: DC | PRN
  Administered 2013-07-17 (×2): 1 mg via INTRAVENOUS

## 2013-07-17 MED ORDER — FENTANYL CITRATE 0.05 MG/ML IJ SOLN
INTRAMUSCULAR | Status: AC
  Filled 2013-07-17: qty 5

## 2013-07-17 MED ORDER — PROMETHAZINE HCL 25 MG/ML IJ SOLN: 6.2500 mg | INTRAMUSCULAR | Status: DC | PRN

## 2013-07-17 MED ORDER — KETOROLAC TROMETHAMINE 30 MG/ML IJ SOLN: 15.0000 mg | Freq: Once | INTRAMUSCULAR | Status: DC | PRN

## 2013-07-17 MED ORDER — EPHEDRINE 5 MG/ML INJ
INTRAVENOUS | Status: AC
  Filled 2013-07-17: qty 10

## 2013-07-17 MED ORDER — LACTATED RINGERS IR SOLN
Status: DC | PRN
  Administered 2013-07-17: 3000 mL

## 2013-07-17 MED ORDER — ROCURONIUM BROMIDE 50 MG/5ML IV SOLN
INTRAVENOUS | Status: AC
  Filled 2013-07-17: qty 1

## 2013-07-17 MED ORDER — ONDANSETRON HCL 4 MG PO TABS: 4.0000 mg | ORAL_TABLET | Freq: Four times a day (QID) | ORAL | Status: DC | PRN

## 2013-07-17 MED ORDER — INDIGOTINDISULFONATE SODIUM 8 MG/ML IJ SOLN
INTRAMUSCULAR | Status: DC | PRN
  Administered 2013-07-17: 5 mL via INTRAVENOUS

## 2013-07-17 MED ORDER — SODIUM CHLORIDE 0.9 % IJ SOLN
INTRAMUSCULAR | Status: AC
  Filled 2013-07-17: qty 50

## 2013-07-17 MED ORDER — ACETAMINOPHEN 10 MG/ML IV SOLN
1000.0000 mg | Freq: Four times a day (QID) | INTRAVENOUS | Status: DC
  Administered 2013-07-17: 1000 mg via INTRAVENOUS
  Filled 2013-07-17 (×4): qty 100

## 2013-07-17 MED ORDER — LACTATED RINGERS IV SOLN
INTRAVENOUS | Status: DC
  Administered 2013-07-17 (×3): via INTRAVENOUS

## 2013-07-17 MED ORDER — ZOLPIDEM TARTRATE 5 MG PO TABS: 5.0000 mg | ORAL_TABLET | Freq: Every evening | ORAL | Status: DC | PRN

## 2013-07-17 MED ORDER — DEXAMETHASONE SODIUM PHOSPHATE 10 MG/ML IJ SOLN
INTRAMUSCULAR | Status: DC | PRN
  Administered 2013-07-17: 10 mg via INTRAVENOUS

## 2013-07-17 MED ORDER — DEXAMETHASONE SODIUM PHOSPHATE 10 MG/ML IJ SOLN
INTRAMUSCULAR | Status: AC
  Filled 2013-07-17: qty 1

## 2013-07-17 MED ORDER — MIDAZOLAM HCL 2 MG/2ML IJ SOLN
INTRAMUSCULAR | Status: DC | PRN
  Administered 2013-07-17: 2 mg via INTRAVENOUS

## 2013-07-17 MED ORDER — HYDROMORPHONE HCL 2 MG PO TABS
2.0000 mg | ORAL_TABLET | ORAL | Status: DC | PRN
  Administered 2013-07-18 (×2): 2 mg via ORAL
  Filled 2013-07-17 (×2): qty 1

## 2013-07-17 MED ORDER — MENTHOL 3 MG MT LOZG: 1.0000 | LOZENGE | OROMUCOSAL | Status: DC | PRN

## 2013-07-17 MED ORDER — EPHEDRINE SULFATE 50 MG/ML IJ SOLN
INTRAMUSCULAR | Status: DC | PRN
  Administered 2013-07-17 (×3): 5 mg via INTRAVENOUS

## 2013-07-17 MED ORDER — SIMETHICONE 80 MG PO CHEW
160.0000 mg | CHEWABLE_TABLET | Freq: Four times a day (QID) | ORAL | Status: DC
  Administered 2013-07-17 – 2013-07-18 (×3): 160 mg via ORAL
  Filled 2013-07-17 (×4): qty 2

## 2013-07-17 MED ORDER — CELECOXIB 200 MG PO CAPS
200.0000 mg | ORAL_CAPSULE | Freq: Two times a day (BID) | ORAL | Status: DC
  Administered 2013-07-17 – 2013-07-18 (×2): 200 mg via ORAL
  Filled 2013-07-17 (×2): qty 1

## 2013-07-17 MED ORDER — NEOSTIGMINE METHYLSULFATE 1 MG/ML IJ SOLN
INTRAMUSCULAR | Status: AC
  Filled 2013-07-17: qty 1

## 2013-07-17 MED ORDER — LIDOCAINE HCL (CARDIAC) 20 MG/ML IV SOLN
INTRAVENOUS | Status: DC | PRN
  Administered 2013-07-17: 80 mg via INTRAVENOUS

## 2013-07-17 MED ORDER — HYDROMORPHONE HCL PF 1 MG/ML IJ SOLN
0.2500 mg | INTRAMUSCULAR | Status: DC | PRN
Start: 2013-07-17 — End: 2013-07-17
  Administered 2013-07-17 (×3): 0.5 mg via INTRAVENOUS

## 2013-07-17 MED ORDER — ARTIFICIAL TEARS OP OINT
TOPICAL_OINTMENT | OPHTHALMIC | Status: AC
  Filled 2013-07-17: qty 3.5

## 2013-07-17 MED ORDER — SERTRALINE HCL 100 MG PO TABS
100.0000 mg | ORAL_TABLET | Freq: Every day | ORAL | Status: DC
  Administered 2013-07-18: 100 mg via ORAL
  Filled 2013-07-17: qty 1

## 2013-07-17 MED ORDER — FENTANYL CITRATE 0.05 MG/ML IJ SOLN
INTRAMUSCULAR | Status: DC | PRN
  Administered 2013-07-17 (×7): 50 ug via INTRAVENOUS

## 2013-07-17 SURGICAL SUPPLY — 75 items
APL SKNCLS STERI-STRIP NONHPOA (GAUZE/BANDAGES/DRESSINGS) ×2
BAG URINE DRAINAGE (UROLOGICAL SUPPLIES) ×3 IMPLANT
BARRIER ADHS 3X4 INTERCEED (GAUZE/BANDAGES/DRESSINGS) ×3 IMPLANT
BENZOIN TINCTURE PRP APPL 2/3 (GAUZE/BANDAGES/DRESSINGS) ×3 IMPLANT
BLADE SURG 10 STRL SS (BLADE) ×1 IMPLANT
BRR ADH 4X3 ABS CNTRL BYND (GAUZE/BANDAGES/DRESSINGS) ×2
CHLORAPREP W/TINT 26ML (MISCELLANEOUS) ×3 IMPLANT
CONT PATH 16OZ SNAP LID 3702 (MISCELLANEOUS) ×3 IMPLANT
COVER MAYO STAND STRL (DRAPES) ×3 IMPLANT
COVER TABLE BACK 60X90 (DRAPES) ×6 IMPLANT
COVER TIP SHEARS 8 DVNC (MISCELLANEOUS) ×2 IMPLANT
COVER TIP SHEARS 8MM DA VINCI (MISCELLANEOUS) ×1
DECANTER SPIKE VIAL GLASS SM (MISCELLANEOUS) ×3 IMPLANT
DRAPE HUG U DISPOSABLE (DRAPE) ×3 IMPLANT
DRAPE LG THREE QUARTER DISP (DRAPES) ×6 IMPLANT
DRAPE WARM FLUID 44X44 (DRAPE) ×3 IMPLANT
ELECT REM PT RETURN 9FT ADLT (ELECTROSURGICAL) ×3
ELECTRODE REM PT RTRN 9FT ADLT (ELECTROSURGICAL) ×2 IMPLANT
EVACUATOR SMOKE 8.L (FILTER) ×3 IMPLANT
GAUZE VASELINE 3X9 (GAUZE/BANDAGES/DRESSINGS) IMPLANT
GLOVE BIO SURGEON STRL SZ 6.5 (GLOVE) ×8 IMPLANT
GLOVE BIOGEL M 6.5 STRL (GLOVE) ×13 IMPLANT
GLOVE BIOGEL PI IND STRL 6.5 (GLOVE) ×4 IMPLANT
GLOVE BIOGEL PI INDICATOR 6.5 (GLOVE) ×4
GOWN STRL REIN XL XLG (GOWN DISPOSABLE) ×18 IMPLANT
GYRUS RUMI II 2.5CM BLUE (DISPOSABLE)
GYRUS RUMI II 3.5CM BLUE (DISPOSABLE)
GYRUS RUMI II 4.0CM BLUE (DISPOSABLE) ×3
KIT ACCESSORY DA VINCI DISP (KITS) ×1
KIT ACCESSORY DVNC DISP (KITS) ×2 IMPLANT
LEGGING LITHOTOMY PAIR STRL (DRAPES) ×3 IMPLANT
NEEDLE INSUFFLATION 120MM (ENDOMECHANICALS) ×3 IMPLANT
OCCLUDER COLPOPNEUMO (BALLOONS) IMPLANT
PACK LAVH (CUSTOM PROCEDURE TRAY) ×3 IMPLANT
PAD PREP 24X48 CUFFED NSTRL (MISCELLANEOUS) ×6 IMPLANT
PLUG CATH AND CAP STER (CATHETERS) ×3 IMPLANT
PROTECTOR NERVE ULNAR (MISCELLANEOUS) ×6 IMPLANT
RUMI II 3.0CM BLUE KOH-EFFICIE (DISPOSABLE) IMPLANT
RUMI II GYRUS 2.5CM BLUE (DISPOSABLE) IMPLANT
RUMI II GYRUS 3.5CM BLUE (DISPOSABLE) IMPLANT
RUMI II GYRUS 4.0CM BLUE (DISPOSABLE) IMPLANT
SET CYSTO W/LG BORE CLAMP LF (SET/KITS/TRAYS/PACK) ×3 IMPLANT
SET IRRIG TUBING LAPAROSCOPIC (IRRIGATION / IRRIGATOR) ×3 IMPLANT
SOLUTION ELECTROLUBE (MISCELLANEOUS) ×3 IMPLANT
SPONGE LAP 18X18 X RAY DECT (DISPOSABLE) ×2 IMPLANT
STRIP CLOSURE SKIN 1/4X4 (GAUZE/BANDAGES/DRESSINGS) ×3 IMPLANT
SUT VIC AB 0 CT1 27 (SUTURE) ×6
SUT VIC AB 0 CT1 27XBRD ANBCTR (SUTURE) ×2 IMPLANT
SUT VIC AB 0 CT1 27XBRD ANTBC (SUTURE) IMPLANT
SUT VICRYL 0 UR6 27IN ABS (SUTURE) ×3 IMPLANT
SUT VICRYL RAPIDE 4/0 PS 2 (SUTURE) ×6 IMPLANT
SUT VLOC 180 0 9IN  GS21 (SUTURE) ×1
SUT VLOC 180 0 9IN GS21 (SUTURE) IMPLANT
SYR 30ML LL (SYRINGE) ×3 IMPLANT
SYR 50ML LL SCALE MARK (SYRINGE) ×3 IMPLANT
SYRINGE 10CC LL (SYRINGE) ×3 IMPLANT
SYSTEM CONVERTIBLE TROCAR (TROCAR) ×3 IMPLANT
TIP RUMI ORANGE 6.7MMX12CM (TIP) IMPLANT
TIP UTERINE 5.1X6CM LAV DISP (MISCELLANEOUS) IMPLANT
TIP UTERINE 6.7X10CM GRN DISP (MISCELLANEOUS) IMPLANT
TIP UTERINE 6.7X6CM WHT DISP (MISCELLANEOUS) IMPLANT
TIP UTERINE 6.7X8CM BLUE DISP (MISCELLANEOUS) ×1 IMPLANT
TOWEL OR 17X24 6PK STRL BLUE (TOWEL DISPOSABLE) ×9 IMPLANT
TRAY FOLEY BAG SILVER LF 14FR (CATHETERS) ×3 IMPLANT
TROCAR 12M 150ML BLUNT (TROCAR) ×3 IMPLANT
TROCAR DILATING TIP 12MM 150MM (ENDOMECHANICALS) IMPLANT
TROCAR DISP BLADELESS 8 DVNC (TROCAR) ×2 IMPLANT
TROCAR DISP BLADELESS 8MM (TROCAR) ×1
TROCAR XCEL 12X100 BLDLESS (ENDOMECHANICALS) IMPLANT
TROCAR XCEL NON-BLD 5MMX100MML (ENDOMECHANICALS) ×3 IMPLANT
TUBING FILTER THERMOFLATOR (ELECTROSURGICAL) ×3 IMPLANT
TUBING NON-CON 1/4 X 20 CONN (TUBING) ×1 IMPLANT
WARMER LAPAROSCOPE (MISCELLANEOUS) ×3 IMPLANT
WATER STERILE IRR 1000ML POUR (IV SOLUTION) ×9 IMPLANT
YANKAUER SUCT BULB TIP NO VENT (SUCTIONS) ×1 IMPLANT

## 2013-07-17 NOTE — Progress Notes (Signed)
Day of Surgery Procedure(s) (LRB): ROBOTIC ASSISTED TOTAL HYSTERECTOMY lysis of adhesions (N/A) CYSTOSCOPY ROBOTIC ASSISTED BILATERAL SALPINGO OOPHORECTOMY (N/A)  Subjective: Patient reports nausea.  Emesis x1, not yet up to ambulate, comfortable   Objective: I have reviewed patient's vital signs, intake and output and medications.  General: alert, cooperative and appears stated age Resp: clear to auscultation bilaterally Cardio: regular rate and rhythm, S1, S2 normal, no murmur, click, rub or gallop GI: normal findings: soft, non-tender and incision: clean, dry and intact Extremities: extremities normal, atraumatic, no cyanosis or edema Vaginal Bleeding: none  Assessment: s/p Procedure(s): ROBOTIC ASSISTED TOTAL HYSTERECTOMY lysis of adhesions (N/A) CYSTOSCOPY ROBOTIC ASSISTED BILATERAL SALPINGO OOPHORECTOMY (N/A): stable  Plan: Advance diet Encourage ambulation  LOS: 0 days  Operative findings reviewed, questions addressed   Lani Mendiola H 07/17/2013, 6:24 PM

## 2013-07-17 NOTE — Interval H&P Note (Signed)
History and Physical Interval Note:  07/17/2013 7:21 AM  [name]  has presented today for surgery, with the diagnosis of bilateral adnexal mass, pelvic pain S2900 [phone]  The various methods of treatment have been discussed with the patient and family. After consideration of risks, benefits and other options for treatment, the patient has consented to  Procedure(s): ROBOTIC ASSISTED TOTAL HYSTERECTOMY (N/A) as a surgical intervention .  The patient's history has been reviewed, patient examined, no change in status, stable for surgery.  I have reviewed the patient's chart and labs.  Questions were answered to the patient's satisfaction.     Providence Stivers H

## 2013-07-17 NOTE — Brief Op Note (Signed)
07/17/2013  6:27 PM  PATIENT:  [name]  42 y.o. female  PRE-OPERATIVE DIAGNOSIS:  bilateral adnexal mass, pelvic pain, fibroids S2900 [id number]  POST-OPERATIVE DIAGNOSIS:  bilateral adnexal mass, pelvic pain S2900 [id number] fibroids endometriosis  PROCEDURE:  Procedure(s): ROBOTIC ASSISTED TOTAL HYSTERECTOMY lysis of adhesions (N/A) CYSTOSCOPY ROBOTIC ASSISTED BILATERAL SALPINGO OOPHORECTOMY (N/A)  SURGEON:  Surgeon(s) and Role:    * Bennye Alm, MD - Primary    * Annamaria Boots, MD - Assisting  PHYSICIAN ASSISTANT:   ASSISTANTS: none   ANESTHESIA:   general  EBL:  Total I/O In: 2000 [I.V.:2000] Out: 1075 [Urine:875; Emesis/NG output:50; Blood:150]  BLOOD ADMINISTERED:none  DRAINS: none   LOCAL MEDICATIONS USED:  Amount: 60 ml and dilute ropivicaine  SPECIMEN:  Source of Specimen:  uterus, tubes, ovaries, cyst fluid  DISPOSITION OF SPECIMEN:  PATHOLOGY  COUNTS:  YES  TOURNIQUET:  * No tourniquets in log *  DICTATION: .Other Dictation: Dictation Number [id number]  PLAN OF CARE: Admit for overnight observation  PATIENT DISPOSITION:  Short Stay   Delay start of Pharmacological VTE agent (>24hrs) due to surgical blood loss or risk of bleeding: not applicable

## 2013-07-17 NOTE — H&P (View-Only) (Signed)
42 y.o.SingleCaucasian G0P0000 female here for consideration for robotically assisted TLH  with Bilateral salpingectomy and with removal of ovaries.    She complains of uterine leiomyomata/fibroids and bilateral endometriomas. PUS on 06/22/13 showed uterus 9.9 x 5.5 x 6.6 cm. Multiple small heterogeneously  isoechoic to hypoechoic lesions, compatible with multifocal  fibroids. The largest of these is in the anterior aspect of the  uterine body on the left side measuring 4.7 x 3.1 x 4.4 cm..  Large cyst in the right ovary measuring 9.3 x 4.9 x 6.4 cm. This generally appears simple, however, there is at least one area of potential mural nodularity    The current method of family planning is none.     Hormones:none   reports that she has never smoked. She has never used smokeless tobacco. She reports that  drinks alcohol. She reports that she does not use illicit drugs.  @CHLEC1NM@  Health Maintenance  Topic Date Due  . Tetanus/tdap  04/16/1988  . Influenza Vaccine  05/05/2013  . Pap Smear  03/03/2016    Family History  Problem Relation Age of Onset  . Hypertension Mother   . Diabetes Father     Type 2  . Osteoporosis Maternal Grandmother   . Colon cancer Paternal Grandfather     Patient Active Problem List   Diagnosis Date Noted  . Complex ovarian cyst 06/26/2013  . Fibroid uterus 06/26/2013    Past Medical History  Diagnosis Date  . Depression   . Fibroid   . Ovarian cyst, bilateral   . Asthma     seasonal  . Seasonal allergies   . GERD (gastroesophageal reflux disease)     diet controlled and otc med prn    Past Surgical History  Procedure Laterality Date  . Wisdom tooth extraction  30 years ago    Allergies: @RRALLERGIES@  Current Outpatient Prescriptions  Medication Sig Dispense Refill  . albuterol (PROVENTIL HFA;VENTOLIN HFA) 108 (90 BASE) MCG/ACT inhaler Inhale 2 puffs into the lungs every 6 (six) hours as needed for wheezing.      . cetirizine (ZYRTEC) 10  MG tablet Take 10 mg by mouth daily.      . fluticasone (FLONASE) 50 MCG/ACT nasal spray Place 2 sprays into the nose daily.      . HYDROcodone-acetaminophen (NORCO/VICODIN) 5-325 MG per tablet Take 1 tablet by mouth every 4 (four) hours as needed for pain.  20 tablet  0  . sertraline (ZOLOFT) 100 MG tablet Take 100 mg by mouth every morning.      . celecoxib (CELEBREX) 200 MG capsule Take 2 tablets am of surgery then 1 by mouth twice a day  6 capsule  0  . HYDROmorphone (DILAUDID) 2 MG tablet Take 1 tablet (2 mg total) by mouth every 4 (four) hours as needed for pain.  20 tablet  0   No current facility-administered medications for this visit.      Exam:    BP 112/62  Pulse 68  Resp 12  Ht 5' 8" (1.727 m)  Wt 254 lb (115.214 kg)  BMI 38.63 kg/m2  LMP 07/01/2013  General appearance: alert, cooperative and appears stated age Head: Normocephalic, without obvious abnormality, atraumatic Neck: no adenopathy, no carotid bruit, no JVD, supple, symmetrical, trachea midline and thyroid not enlarged, symmetric, no tenderness/mass/nodules Lungs: clear to auscultation bilaterally Heart: regular rate and rhythm, S1, S2 normal, no murmur, click, rub or gallop Abdomen: soft, non-tender; bowel sounds normal; no masses,  no organomegaly Extremities:   extremities normal, atraumatic, no cyanosis or edema Lymph nodes: Cervical, supraclavicular, and axillary nodes normal. no inguinal nodes palpated Neurologic: Grossly normal    Pelvic: deferred                    A/P:  The planned procedure was discussed with the patient.  Pre-op instructions, hospital course, and post-op instructions reviewed.  Post op instruction booklet given and reviewed.  Risks and possible complications discussed, including but not limited to, bleeding and possible transfusion; infection; anesthesia complications; injury to visceral organ requiring further surgery, either immediately or delayed; wound complications including  infection, blood collections, and bowel herniation; allergic reactions; swelling of the face due to prolonged positioning;  VTE or air emboli; nerve injury related to prolonged positioning; even death.    The patient was given the opportunity to watch the informed consent video on hysterectomy.  Her questions were invited and answered.  The patient states she understands the risks and possible complications, and wishes to proceed as planned.  Consent form signed.  She was instructed to take two 200 mg celebrex capsules with a small sip of water the morning of the procedure, and then to take one bid on POD 1 and 2.  Rx given for vicoden 5/325 for now and dilaudid 2mg for afterwards to take one or two po q4-6 hours prn post op pain.    Ready for surgery.    

## 2013-07-17 NOTE — Anesthesia Preprocedure Evaluation (Signed)
Anesthesia Evaluation  Patient identified by MRN, date of birth, ID band Patient awake    Reviewed: Allergy & Precautions, H&P , NPO status , Patient's Chart, lab work & pertinent test results  Airway Mallampati: II TM Distance: >3 FB Neck ROM: full    Dental no notable dental hx. (+) Teeth Intact   Pulmonary  Just used her inhaler. breath sounds clear to auscultation  Pulmonary exam normal       Cardiovascular negative cardio ROS      Neuro/Psych negative neurological ROS     GI/Hepatic negative GI ROS, Neg liver ROS,   Endo/Other  negative endocrine ROS  Renal/GU negative Renal ROS  negative genitourinary   Musculoskeletal   Abdominal Normal abdominal exam  (+)   Peds  Hematology negative hematology ROS (+)   Anesthesia Other Findings   Reproductive/Obstetrics negative OB ROS                           Anesthesia Physical Anesthesia Plan  ASA: II  Anesthesia Plan: General   Post-op Pain Management:    Induction: Intravenous  Airway Management Planned: Oral ETT  Additional Equipment:   Intra-op Plan:   Post-operative Plan: Extubation in OR  Informed Consent: I have reviewed the patients History and Physical, chart, labs and discussed the procedure including the risks, benefits and alternatives for the proposed anesthesia with the patient or authorized representative who has indicated his/her understanding and acceptance.   Dental Advisory Given  Plan Discussed with: CRNA and Surgeon  Anesthesia Plan Comments:         Anesthesia Quick Evaluation

## 2013-07-17 NOTE — Interval H&P Note (Signed)
History and Physical Interval Note:  07/17/2013 7:21 AM  [name]  has presented today for surgery, with the diagnosis of bilateral adnexal mass, pelvic pain [id number] [id number]  The various methods of treatment have been discussed with the patient and family. After consideration of risks, benefits and other options for treatment, the patient has consented to  Procedure(s): ROBOTIC ASSISTED TOTAL HYSTERECTOMY (N/A) as a surgical intervention .  The patient's history has been reviewed, patient examined, no change in status, stable for surgery.  I have reviewed the patient's chart and labs.  Questions were answered to the patient's satisfaction.     Nealie Mchatton H   

## 2013-07-17 NOTE — Anesthesia Postprocedure Evaluation (Signed)
  Anesthesia Post-op Note  Anesthesia Post Note  Patient: [name]  Procedure(s) Performed: Procedure(s) (LRB): ROBOTIC ASSISTED TOTAL HYSTERECTOMY lysis of adhesions (N/A) CYSTOSCOPY ROBOTIC ASSISTED BILATERAL SALPINGO OOPHORECTOMY (N/A)  Anesthesia type: General  Patient location: PACU  Post pain: Pain level controlled  Post assessment: Post-op Vital signs reviewed  Last Vitals:  Filed Vitals:   07/17/13 1356  BP: 130/81  Pulse: 80  Temp: 36.9 C  Resp: 16    Post vital signs: Reviewed  Level of consciousness: sedated  Complications: No apparent anesthesia complications

## 2013-07-17 NOTE — H&P (View-Only) (Signed)
42 y.o. Partnered Caucasian female   G0P0000 here for annual exam. Pt is currently sexually active.  Pt reports pelvic pain for 1-2y but had gotten much wrose over the last 54m.  described as dull ache and pressure-abdominal.  Pt denies weight loss.  Pt had u/s done after PMD noted that her uterus felt an enlarged uterus, pt had u/s done 8/16 and was noted to have multiple fibroids and bilateral ovarian cysts, one complex, no torsion.  Pt had not had a pelvic exam in 7y due to loss of insurance.  No pain with vaginal penetration  Patient's last menstrual period was 06/04/2013.          Sexually active: no  The current method of family planning is not indicated  Exercising: yes  The patient does not participate in regular exercise at present. Last pap: 4/14- Negative Alcohol: occasionally  Tobacco: no BSE: no Mammogram:  none  Hgb: PCP ; Urine: PCP  No health maintenance topics applied.  Family History  Problem Relation Age of Onset  . Hypertension Mother   . Diabetes Father     Type 2  . Osteoporosis Maternal Grandmother   . Colon cancer Paternal Grandfather     There are no active problems to display for this patient.   Past Medical History  Diagnosis Date  . Depression   . Fibroid     Past Surgical History  Procedure Laterality Date  . Wisdom tooth extraction  30 years ago    Allergies: Sulfa antibiotics  Current Outpatient Prescriptions  Medication Sig Dispense Refill  . cetirizine (ZYRTEC) 10 MG tablet Take 10 mg by mouth daily.      . cyclobenzaprine (FLEXERIL) 10 MG tablet Take 10 mg by mouth 3 (three) times daily as needed for muscle spasms.      . fluticasone (FLONASE) 50 MCG/ACT nasal spray Place 2 sprays into the nose daily.      Marland Kitchen HYDROcodone-acetaminophen (NORCO/VICODIN) 5-325 MG per tablet Take 1 tablet by mouth every 4 (four) hours as needed for pain.  12 tablet  0  . sertraline (ZOLOFT) 100 MG tablet Take 100 mg by mouth every morning.      Marland Kitchen albuterol  (PROVENTIL HFA;VENTOLIN HFA) 108 (90 BASE) MCG/ACT inhaler Inhale 2 puffs into the lungs every 6 (six) hours as needed for wheezing.       No current facility-administered medications for this visit.    ROS: Pertinent items are noted in HPI.  Exam:    Ht 5\' 8"  (1.727 m)  Wt 253 lb (114.76 kg)  BMI 38.48 kg/m2  LMP 06/04/2013 Weight change: @WEIGHTCHANGE @ Last 3 height recordings:  Ht Readings from Last 3 Encounters:  06/26/13 5\' 8"  (1.727 m)   General appearance: alert, cooperative and appears stated age Head: Normocephalic, without obvious abnormality, atraumatic Neck: no adenopathy, no carotid bruit, no JVD, supple, symmetrical, trachea midline and thyroid not enlarged, symmetric, no tenderness/mass/nodules Lungs: clear to auscultation bilaterally Breasts: normal appearance, no masses or tenderness Heart: regular rate and rhythm, S1, S2 normal, no murmur, click, rub or gallop Abdomen: soft, non-tender; bowel sounds normal; no masses,  no organomegaly Extremities: extremities normal, atraumatic, no cyanosis or edema Skin: Skin color, texture, turgor normal. No rashes or lesions Lymph nodes: Cervical, supraclavicular, and axillary nodes normal. no inguinal nodes palpated Neurologic: Grossly normal    Pelvic: External genitalia:  no lesions              Urethra: normal appearing urethra with no  masses, tenderness or lesions              Bartholins and Skenes: Bartholin's, Urethra, Skene's normal                 Vagina: normal appearing vagina with normal color and discharge, no lesions              Cervix: normal appearance              Pap taken: no        Bimanual Exam:  Uterus:  uterus is normal size, shape, consistency and nontender, enlarged to 10 week's size, irregular                                      Adnexa:    Gentle exam due to known bilateral complex masses                                      Rectovaginal: Confirms, minimal rectal mass                                       Anus:  normal sphincter tone, no lesions  A: fibroid uterus Complex adnexal mass Pain management mammogram     P: reviewed images of u/s will get ca125 Will refill vicoden for now Pt will get screening mammogram    An After Visit Summary was printed and given to the patient.

## 2013-07-17 NOTE — OR Nursing (Signed)
Upon uncovering pt left arm it was noted she had indention of o2 saturation cord . It was under her fore arm

## 2013-07-17 NOTE — Anesthesia Postprocedure Evaluation (Signed)
  Anesthesia Post-op Note  Patient: [name]  Procedure(s) Performed: Procedure(s): ROBOTIC ASSISTED TOTAL HYSTERECTOMY lysis of adhesions (N/A) CYSTOSCOPY ROBOTIC ASSISTED BILATERAL SALPINGO OOPHORECTOMY (N/A)  Patient Location: [facility]  Anesthesia Type:General  Level of Consciousness: awake, alert  and oriented  Airway and Oxygen Therapy: Patient Spontanous Breathing and Patient connected to nasal cannula oxygen  Post-op Pain: mild  Post-op Assessment: Post-op Vital signs reviewed and Patient's Cardiovascular Status Stable  Post-op Vital Signs: Reviewed and stable  Complications: No apparent anesthesia complications

## 2013-07-17 NOTE — Transfer of Care (Signed)
Immediate Anesthesia Transfer of Care Note  Patient: [name]  Procedure(s) Performed: Procedure(s): ROBOTIC ASSISTED TOTAL HYSTERECTOMY lysis of adhesions (N/A) CYSTOSCOPY ROBOTIC ASSISTED BILATERAL SALPINGO OOPHORECTOMY (N/A)  Patient Location: PACU  Anesthesia Type:General  Level of Consciousness: awake, oriented, sedated and patient cooperative  Airway & Oxygen Therapy: Patient Spontanous Breathing and Patient connected to face mask oxygen  Post-op Assessment: Report given to PACU RN and Post -op Vital signs reviewed and stable  Post vital signs: Reviewed and stable  Complications: No apparent anesthesia complications

## 2013-07-18 ENCOUNTER — Encounter (HOSPITAL_COMMUNITY): Payer: Self-pay | Admitting: Gynecology

## 2013-07-18 LAB — CBC
MCHC: 34.3 g/dL (ref 30.0–36.0)
MCV: 85.7 fL (ref 78.0–100.0)
Platelets: 231 10*3/uL (ref 150–400)
RBC: 4.19 MIL/uL (ref 3.87–5.11)
RDW: 12.5 % (ref 11.5–15.5)
WBC: 14 10*3/uL — ABNORMAL HIGH (ref 4.0–10.5)

## 2013-07-18 MED ORDER — ESTRADIOL 0.1 MG/24HR TD PTWK: 1.0000 | MEDICATED_PATCH | TRANSDERMAL | Status: DC

## 2013-07-18 MED ORDER — DSS 100 MG PO CAPS: 100.0000 mg | ORAL_CAPSULE | Freq: Two times a day (BID) | ORAL | Status: DC

## 2013-07-18 MED ORDER — ALBUTEROL SULFATE HFA 108 (90 BASE) MCG/ACT IN AERS: 2.0000 | INHALATION_SPRAY | Freq: Four times a day (QID) | RESPIRATORY_TRACT | Status: AC | PRN

## 2013-07-18 MED ORDER — SIMETHICONE 80 MG PO CHEW: 160.0000 mg | CHEWABLE_TABLET | Freq: Four times a day (QID) | ORAL | Status: DC

## 2013-07-18 NOTE — Op Note (Signed)
NAMEYURIANA, [name]            ACCOUNT NO.:  [id number]  MEDICAL RECORD NO.:  [id number]  LOCATION:  9319                          FACILITY:  [facility]  PHYSICIAN:  Ivor Costa. Farrel Gobble, M.D. DATE OF BIRTH:  [birth year]  DATE OF PROCEDURE:  07/17/2013 DATE OF DISCHARGE:                              OPERATIVE REPORT   PREOPERATIVE DIAGNOSES: 1. Bilateral complex adnexal mass. 2. Pelvic pain. 3. Fibroids.  POSTOPERATIVE DIAGNOSES: 1. Bilateral complex adnexal mass. 2. Pelvic pain. 3. Fibroids. 4. Endometriosis.  PROCEDURE:  A robotic-assisted total hysterectomy.  ADDITIONAL PROCEDURES:  Robotic-assisted bilateral salpingo- oophorectomy, lysis of adhesions, and cystoscopy.  SURGEON:  Ivor Costa. Farrel Gobble, MD  ASSISTANTHyacinth Meeker.  ANESTHESIA:  General.  IV FLUIDS:  2000 mL of lactated Ringer's.  ESTIMATED BLOOD LOSS:  150 mL.  URINE OUTPUT:  875 mL.  INDICATIONS:  The patient is a 42 year old, P0 who presented with a several month history of increasing pelvic pain.  The patient had been evaluated by her PCP and ultimately ended up in the emergency room, at which point, she had an ultrasound which showed bilateral complex adnexal masses and a fibroid uterus.  The patient had been requiring Vicodin for comfort and now presents for definitive surgery.  FINDINGS:  A multifibroid uterus.  There were bilateral adnexal masses. This large cyst on the right was drained for what appeared to be clear fluid, however endometriotic-appearing fluid was noted intraoperatively. The ovaries were adherent to the posterior uterus and cul-de-sac.  Cul- de-sac was obliterated.  The left adnexa similarly had a complex adnexal cyst that was consistent with an endometrioma.  There were other spot areas of endometriosis mostly in the posterior uterus, otherwise the tubes were normal-appearing.  PATHOLOGY:  Uterus, tubes, cervix, ovaries, and cyst fluid.  COMPLICATIONS:  None.  PROCEDURE IN DETAIL:   Patient was taken to the operating room, placed in dorsal lithotomy position, prepped and draped in usual sterile fashion. A bimanual exam was performed.  The mobility of the uterus was confirmed.  The patient was then prepped and draped in usual sterile fashion.  Prior to prepping, a mark was made on the abdomen for infracostal entrance point using the midclavicular line.  The area was prepped and draped.  The area was injected then with a small amount of 0.25% Marcaine, and the upper abdomen was entered atraumatically with a blunt-tipped probe Optiview.  The pelvis was inspected.  At this point, the bivalve speculum was placed in the vagina.  The cervix was visualized.  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  The cervix was grasped with single-tooth tenaculum.  The survey was unable to be placed in the vagina and 2 stents were used for better visualization.  A second single- tooth was placed inferiorly and using these two single tooth, the cervix was able to be dilated up, the uterus ultimately sounded to 10.  The cervix was then dilated up to 23-French.  The cervix was then measured and a medium KoH ring was felt to be appropriate.  A uterine manipulator was then advanced through the cervix and inflated and the [facility]  ring was gently guided  to the top of the vagina and confirmation and placement of the [facility] ring was assured.  Two stay sutures had been placed with 0 Vicryl prior to this anteriorly and posteriorly for later manipulation. Gloves were then changed.  Further inspection of the upper abdomen ensued, a #8 port was made for later robotic docking and using the 8 port in the right mid quadrant and the previously placed 5 port were able to determine that the cyst was somewhat mobile and that the procedure would be able to be performed robotically.  A supraumbilical left-sided ports were then made under direct visualization.  The  robot was then docked.  Gloves were changed.  The cyst on the right-hand side was then punctured and drained over 300 mL of clear fluid, and this allowed better visualization of the right adnexa.  The right ovary was able to be deviated medially.  The ureter was easily visualized and was inferior to this area.  The tube was then grasped and deviated medially and then using PK cautery and shears, the infundibulopelvic ligament was transected and noted to be hemostatic.  The dissection was carried through including the posterior broad ligament.  The round ligament was then identified.  The uterus was deviated, was similarly treated with the PK and sharply dissected the posterior leaf, broad ligament was dissected through to meet the prior dissection.  The anterior peritoneum was then incised and sharply dissecting down anteriorly beyond the midline.  Attention was then turned back towards the right adnexa.  The ovary was markedly scarred inferiorly.  We were able to elevate it and the posterior peritoneum was sharply dissected off the ovary, similarly was sharply dissected off the underlying peritoneum.  Careful attention to the ureter occurred at all times.  Attention was then turned back to the bladder flap that was partially created.  The bladder and the peritoneum were sharply dissected off the anterior uterus.  Any areas of bleeding were then treated appropriately with cautery.  The uterine vessels were able to be identified and they were skeletonized.  The placement of the [facility] was confirmed and the uterine vessels were then treated with PK inferior to the point of the [facility] ring dissection, they were not transected at this point.  The ovary was further addressed posteriorly.  It was noted to be somewhat scarred and using gentle traction, we were ultimately able to sharply and bluntly dissect the ovary off from the posterior uterus.  Attention was then turned to the left side.  The  uterus was deviated.  The peristalsis of the ureter was again confirmed and noted to be remote from the infundibulopelvic ligament and the tube was then elevated deviated medially and infundibular ligament was treated with PK cautery and sharply dissected off.  In a similar fashion, the round ligament was cauterized and sharply dissected off.  The posterior leaf of the broad ligament was treated with the coag and Endoshears and the anterior leaf of the broad ligament was similarly taken down to meet the prior dissection.  This ovary was also noted to be scarred, but was a little more mobile than the right-hand side.  There was some scarring of the bowel  to the posterior uterus.  The uterus was deflected anteriorly in order to get a better visualization of the posterior uterus.  Staying high on the uterine serosa, the bowel was then sharply dissected off and was able to be sharply dissected to beyond the area of the [facility] ring.  Attention  was then turned back towards the bladder.  The bladder was felt to be sufficiently dissected off.  The vesicouterine peritoneum was able to be identified.  The anterior colpotomy was then performed with the Endoshears at the level of the [facility] ring.  The dissection was carried through laterally towards the right, and then posteriorly were noted to be free of all of our pedicles and a prior bowel dissection.  The dissection then was addressed towards the left and a colpotomy was performed.  Attempt to deliver the uterus was unsuccessful due to the multiple fibroids.  A single-tooth tenaculum was then placed through the operative port on the robot, in addition through the assistance port using the single tooth tenaculum, fibroids were able to be mobilized in order to aid in delivery. Despite mobilization, the uterus still was morcellated vaginally, and was ultimately delivered that route.  The balloon was then blown up.  The cuff was identified, it was noted to  be hemostatic.  The cuff was plicated with the V-Loc suture starting at the right adnexa and marching through towards the left.  The vaginal mucosa was incorporated in each bite.  The suture was then brought back two bites towards the midline for security of the closure.  The pelvis was then irrigated with a copious amount of saline and the cuff was noted to be hemostatic.  There was no area of bleeding from the pedicles.  The upper abdomen was inspected and noted to be unremarkable.  The needle was then removed and the pneumoperitoneum was released.  The supraumbilical incision was then closed with 0 Vicryl and the skin on all 3 ports was closed with 4-0 plain Dermabond.  During the closure process, the patient be given indigo carmine and cystoscopy was performed.  The Foley was removed.  The urethra was noted to be unremarkable.  The peristalsis from the ureters was immediately noted. Integrity of the bladder was also noted.  A CO2 bubble was noted on removal of the cystoscope, thin white plaque was noted on the inferior aspect and photograph of this was taken to be shown to Urology.  The patient tolerated the procedure well.  Sponge, lap, and needle counts were correct x2.  She was given 2 g of cefotetan intraoperatively and x- ray in the OR successfully.     Ivor Costa. Farrel Gobble, M.D.     THL/MEDQ  D:  07/17/2013  T:  07/18/2013  Job:  [id number]

## 2013-07-18 NOTE — Progress Notes (Signed)
Pt ambulated out  Teaching complete 

## 2013-07-18 NOTE — Progress Notes (Signed)
1 Day Post-Op Procedure(s) (LRB): ROBOTIC ASSISTED TOTAL HYSTERECTOMY lysis of adhesions (N/A) CYSTOSCOPY ROBOTIC ASSISTED BILATERAL SALPINGO OOPHORECTOMY (N/A)  Subjective: Patient reports tolerating PO.    Objective: I have reviewed patient's vital signs, intake and output, medications and labs.  General: alert, cooperative and appears stated age Resp: clear to auscultation bilaterally Cardio: regular rate and rhythm, S1, S2 normal, no murmur, click, rub or gallop GI: soft, non-tender; bowel sounds normal; no masses,  no organomegaly Extremities: extremities normal, atraumatic, no cyanosis or edema Vaginal Bleeding: none  Assessment: s/p Procedure(s): ROBOTIC ASSISTED TOTAL HYSTERECTOMY lysis of adhesions (N/A) CYSTOSCOPY ROBOTIC ASSISTED BILATERAL SALPINGO OOPHORECTOMY (N/A): stable and progressing well  Plan: Discontinue IV fluids can d/c home once able to void  LOS: 1 day    [name] H 07/18/2013, 7:26 AM

## 2013-07-24 ENCOUNTER — Ambulatory Visit (INDEPENDENT_AMBULATORY_CARE_PROVIDER_SITE_OTHER): Payer: BC Managed Care – PPO | Admitting: Gynecology

## 2013-07-24 ENCOUNTER — Encounter: Payer: Self-pay | Admitting: Gynecology

## 2013-07-24 VITALS — BP 120/74 | HR 62 | Resp 18 | Ht 68.0 in | Wt 246.0 lb

## 2013-07-24 DIAGNOSIS — N80129 Deep endometriosis of ovary, unspecified ovary: Secondary | ICD-10-CM

## 2013-07-24 DIAGNOSIS — Z9889 Other specified postprocedural states: Secondary | ICD-10-CM

## 2013-07-24 DIAGNOSIS — N809 Endometriosis, unspecified: Secondary | ICD-10-CM

## 2013-07-24 DIAGNOSIS — D259 Leiomyoma of uterus, unspecified: Secondary | ICD-10-CM

## 2013-07-24 HISTORY — DX: Endometriosis, unspecified: N80.9

## 2013-07-24 HISTORY — DX: Deep endometriosis of ovary, unspecified ovary: N80.129

## 2013-07-24 NOTE — Progress Notes (Signed)
Subjective:     [name] is a 42 y.o. female who presents to the clinic 1 weeks status post robotic hysterectomy with bilateral sapingoophrectomy  for adnexal mass and fibroids. Eating a regular diet without difficulty. Bowel movements are normal. The patient is not having any pain.  Pt overall is feeling well.  Denies vaginal bleeding.  Using occasional tylenol.  Pt was placed on climara patch post-op, no hot flashes.   The following portions of the patient's history were reviewed and updated as appropriate: allergies, current medications, past family history, past medical history, past social history, past surgical history and problem list.  Review of Systems Pertinent items are noted in HPI.    Objective:    LMP 07/01/2013 General:  alert, cooperative and appears stated age  Abdomen: soft, bowel sounds active, non-tender  Incision:   healing well, no drainage, no erythema, no hernia, no seroma, no swelling, no dehiscence, incision well approximated     Assessment:    Doing well postoperatively. Operative findings again reviewed. Pathology report discussed.    Plan:    1. Continue any current medications. 2. Wound care discussed. 3. Activity restrictions: no driving, no repetitive motion and no sports 4. 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 Follow up: 4 week  Pt aware that post-op estrogen may cause endometriosis to not regress, would like to continue for now

## 2013-07-28 MED FILL — Heparin Sodium (Porcine) Inj 5000 Unit/ML: INTRAMUSCULAR | Qty: 1 | Status: AC

## 2013-08-02 ENCOUNTER — Telehealth: Payer: Self-pay | Admitting: Gynecology

## 2013-08-02 NOTE — Telephone Encounter (Signed)
Patient would like to change her prescription. She is currently on the estrogen patch. It keeps coming off and she would like to switch to the pill. Please advise.

## 2013-08-02 NOTE — Telephone Encounter (Signed)
Dr. Farrel Gobble, can you please review and enter if you are okay with patient switching to Estrogen Pill?

## 2013-08-02 NOTE — Telephone Encounter (Signed)
Patient is currently using the estrogen patch but it keeps falling off. She would like to switch to the pill. Please advise.

## 2013-08-03 ENCOUNTER — Other Ambulatory Visit: Payer: Self-pay | Admitting: Gynecology

## 2013-08-03 DIAGNOSIS — E8940 Asymptomatic postprocedural ovarian failure: Secondary | ICD-10-CM

## 2013-08-03 MED ORDER — ESTRADIOL 0.5 MG PO TABS: 1.0000 mg | ORAL_TABLET | Freq: Every day | ORAL | Status: DC

## 2013-08-04 NOTE — Telephone Encounter (Signed)
Medication placed, patient notified,

## 2013-08-08 ENCOUNTER — Telehealth: Payer: Self-pay | Admitting: Gynecology

## 2013-08-08 NOTE — Telephone Encounter (Signed)
Patient states right under the main incision, skin is healed, but feels hard and having some pain at the same site. States pain is sharp/pulling pain, has this pain with movement and sitting upright. Took Motrin and tylenol, states that it does help with the pain. Rates pain 3/10. No fevers.

## 2013-08-08 NOTE — Telephone Encounter (Signed)
Per Dr. Farrel Gobble to come tomorrow at 0900, spoke with patient, she is agreeable.

## 2013-08-08 NOTE — Telephone Encounter (Signed)
Patient had recent surgery and reports, "Increased pain and hardness in the last few days under the largest incision site."

## 2013-08-09 ENCOUNTER — Encounter: Payer: Self-pay | Admitting: Gynecology

## 2013-08-09 ENCOUNTER — Ambulatory Visit (INDEPENDENT_AMBULATORY_CARE_PROVIDER_SITE_OTHER): Payer: BC Managed Care – PPO | Admitting: Gynecology

## 2013-08-09 VITALS — BP 100/64 | HR 78 | Resp 18 | Wt 248.0 lb

## 2013-08-09 DIAGNOSIS — Z9889 Other specified postprocedural states: Secondary | ICD-10-CM

## 2013-08-09 NOTE — Progress Notes (Signed)
Subjective:     Patient ID: [name], female   DOB: Nov 08, 1970, 42 y.o.   MRN: [id number]  HPI Comments: Pt 3w post-op called to report a pulling discomfort in her upper incision, no bruising or discharge from incision.  Reports thin red vaginal bleeding 1d last week, now spotting brown only.    Review of Systems  Constitutional: Positive for fatigue. Negative for fever, chills and diaphoresis.  Gastrointestinal: Negative for nausea, vomiting, abdominal pain, diarrhea and abdominal distention.  Genitourinary: Positive for vaginal bleeding (spotting). Negative for vaginal pain.       Objective:   Physical Exam  Constitutional: She is oriented to person, place, and time. She appears well-developed and well-nourished.  Abdominal: Soft. She exhibits no distension. There is no tenderness. There is no rebound and no guarding.    Neurological: She is alert and oriented to person, place, and time.       Assessment:     Post-op pain     Plan:     Pt assured normal post-op findings/healing activities as tolerated

## 2013-08-23 ENCOUNTER — Encounter: Payer: Self-pay | Admitting: Gynecology

## 2013-08-23 ENCOUNTER — Ambulatory Visit (INDEPENDENT_AMBULATORY_CARE_PROVIDER_SITE_OTHER): Payer: BC Managed Care – PPO | Admitting: Gynecology

## 2013-08-23 VITALS — BP 126/80 | HR 80 | Resp 16 | Ht 68.0 in | Wt 249.0 lb

## 2013-08-23 DIAGNOSIS — Z9889 Other specified postprocedural states: Secondary | ICD-10-CM

## 2013-08-23 DIAGNOSIS — N809 Endometriosis, unspecified: Secondary | ICD-10-CM

## 2013-08-23 DIAGNOSIS — D259 Leiomyoma of uterus, unspecified: Secondary | ICD-10-CM

## 2013-08-23 MED ORDER — ESTRADIOL 1 MG PO TABS: 1.0000 mg | ORAL_TABLET | Freq: Every day | ORAL | Status: DC

## 2013-08-23 NOTE — Progress Notes (Signed)
Subjective:     [name] is a 42 y.o. female who presents to the clinic 5 weeks status post robotic hysterectomy with BSO for fibroids and bilateral endometrimonas. Pt is tolerating estrogen supplementation. Mild twinges but no pain.  Small spotting but no bleeding, brown with wiping The following portions of the patient's history were reviewed and updated as appropriate: allergies, current medications, past family history, past medical history, past social history, past surgical history and problem list.  Review of Systems Pertinent items are noted in HPI.    Objective:    BP 126/80  Pulse 80  Resp 16  Ht 5\' 8"  (1.727 m)  Wt 249 lb (112.946 kg)  BMI 37.87 kg/m2  LMP 07/01/2013 General:  alert, cooperative and appears stated age  Abdomen: Soft nontender  Incision:   healed     Pelvic exam: VULVA: normal appearing vulva with no masses, tenderness or lesions, VAGINA: normal appearing vagina with normal color and discharge, no lesions, CERVIX: absent, UTERUS: surgically absent, vaginal cuff well healed, ADNEXA: surgically absent bilateral.   Assessment:    Doing well postoperatively.   Plan:    1. Continue any current medications. Refill estrogen for year 3. Activity restrictions: slowly increasse but no sex for 8w post-op 4. F/u prn and annual

## 2014-01-20 IMAGING — US US TRANSVAGINAL NON-OB
1 series · 13 of 25 positions shown · non-contrast
Comparison: 06/19/2013.

CLINICAL DATA: Pelvic pain. Evaluate for potential ovarian torsion.

EXAM:
TRANSABDOMINAL AND TRANSVAGINAL ULTRASOUND OF PELVIS
DOPPLER ULTRASOUND OF OVARIES
TECHNIQUE: Both transabdominal and transvaginal ultrasound examinations of the
pelvis were performed. Transabdominal technique was performed for
global imaging of the pelvis including uterus, ovaries, adnexal
regions, and pelvic cul-de-sac.
It was necessary to proceed with endovaginal exam following the
transabdominal exam to visualize the ovaries. Color and duplex
Doppler ultrasound was utilized to evaluate blood flow to the
ovaries.

[Series 1: us transvaginal non-ob · 0.22mm/px · 13 of 85 slices shown]
[im 1/85]
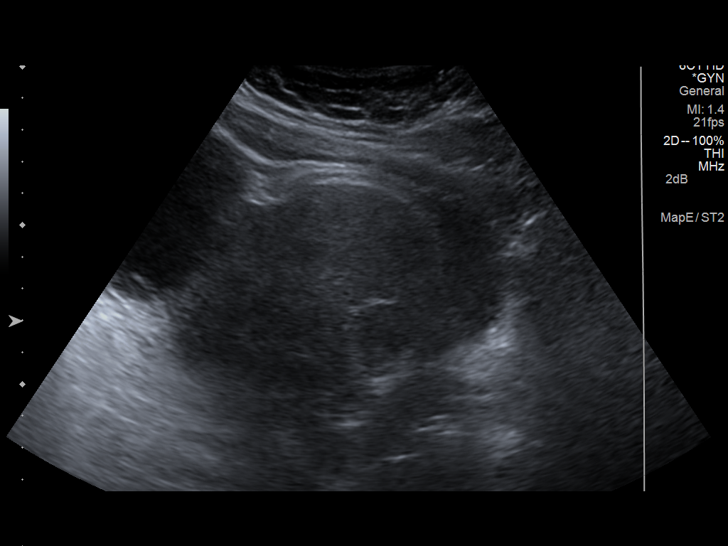
[im 8/85]
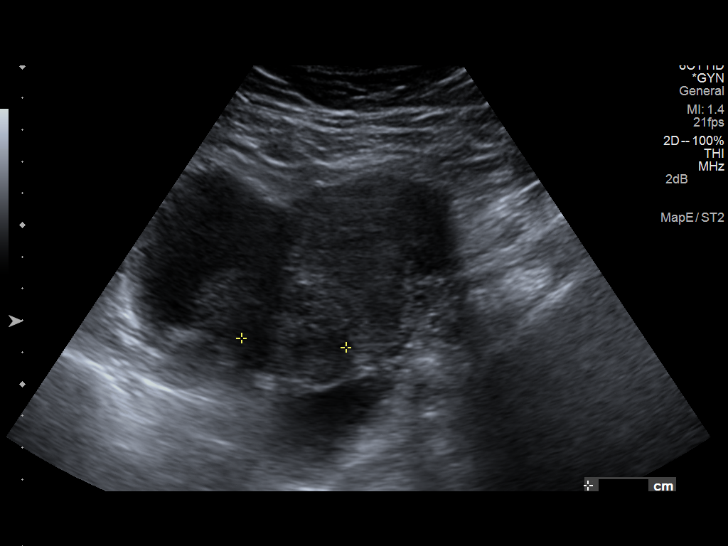
[im 15/85]
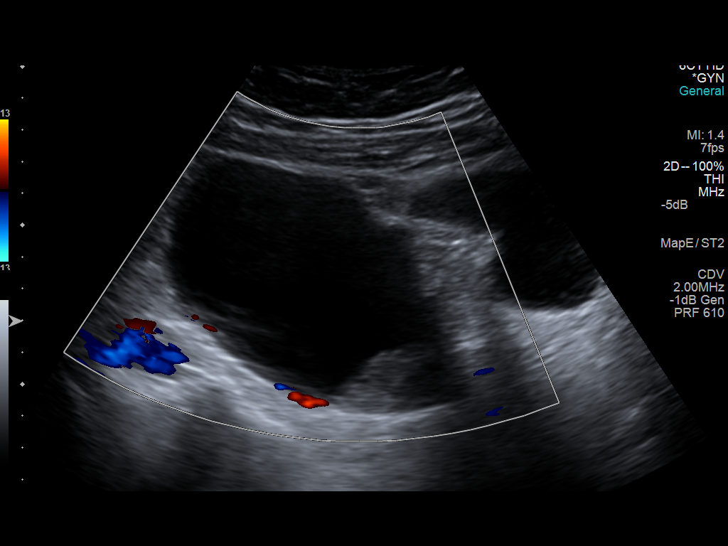
[im 22/85]
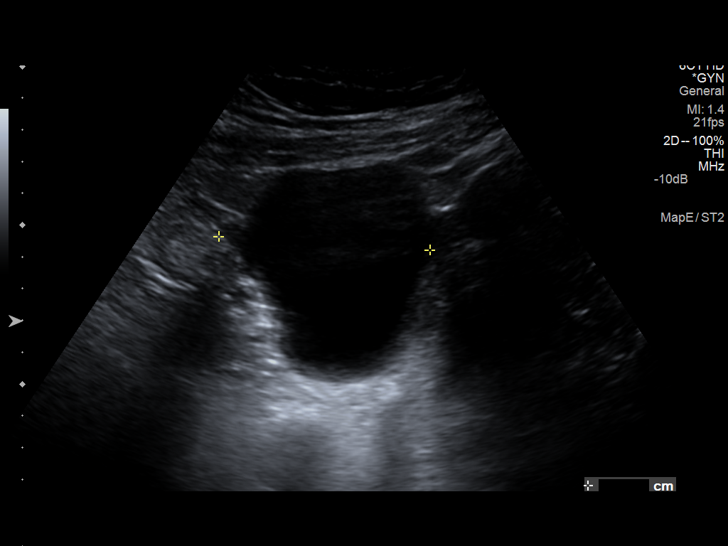
[im 29/85]
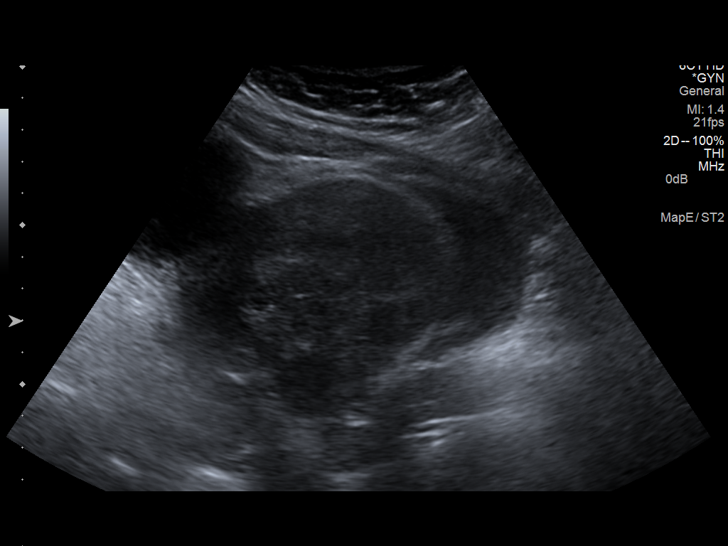
[im 36/85]
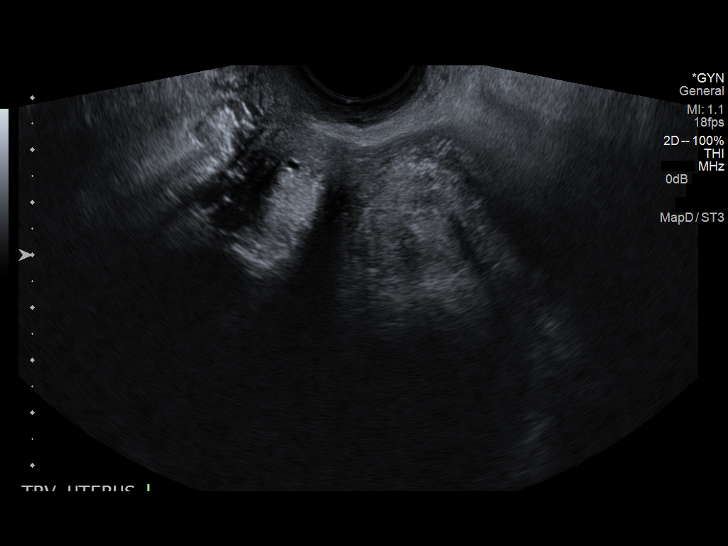
[im 43/85]
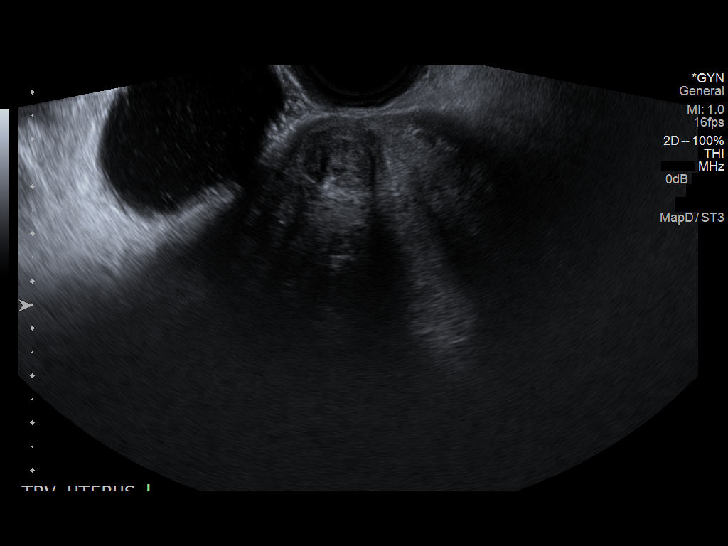
[im 50/85]
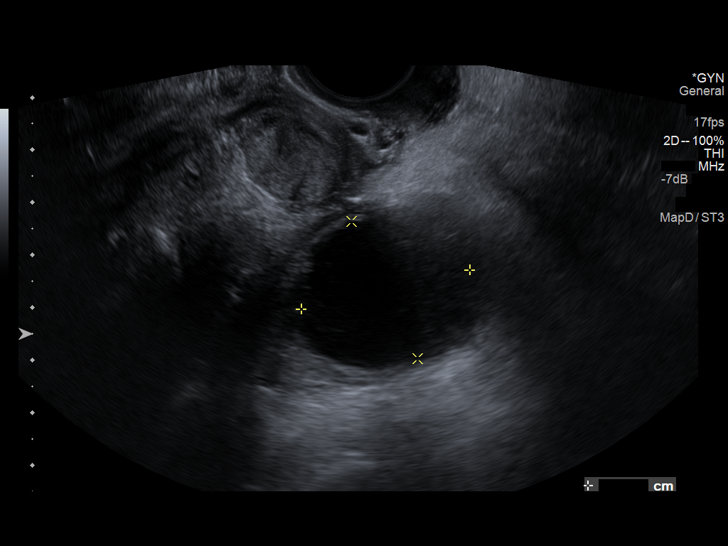
[im 57/85]
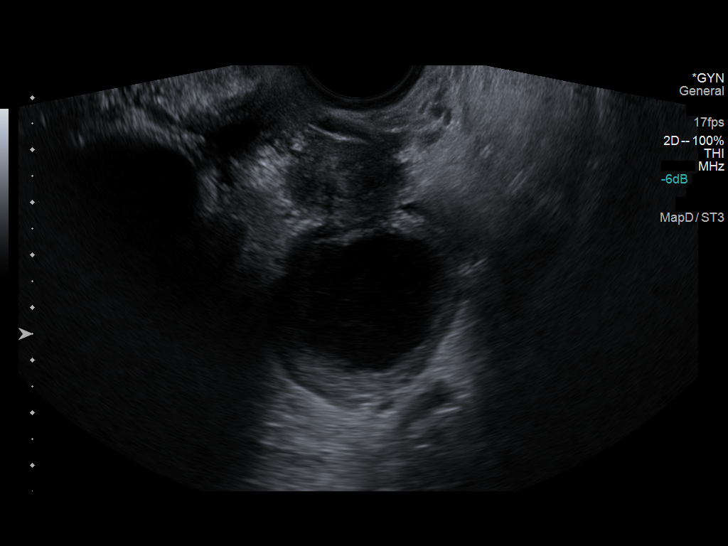
[im 64/85]
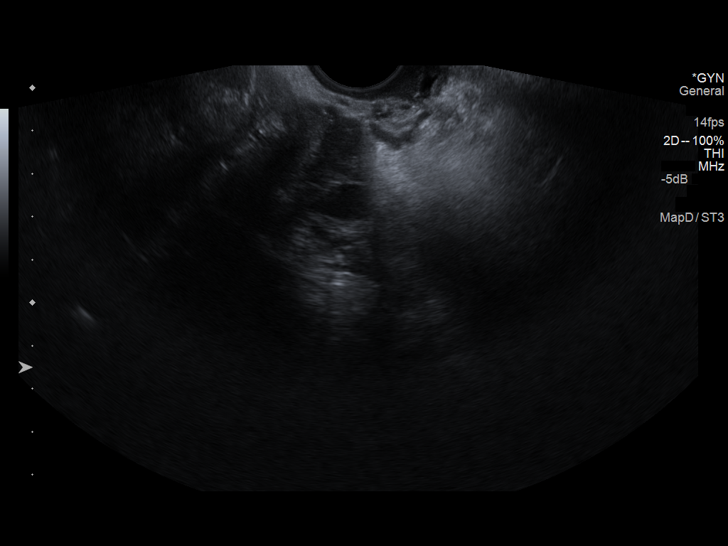
[im 71/85]
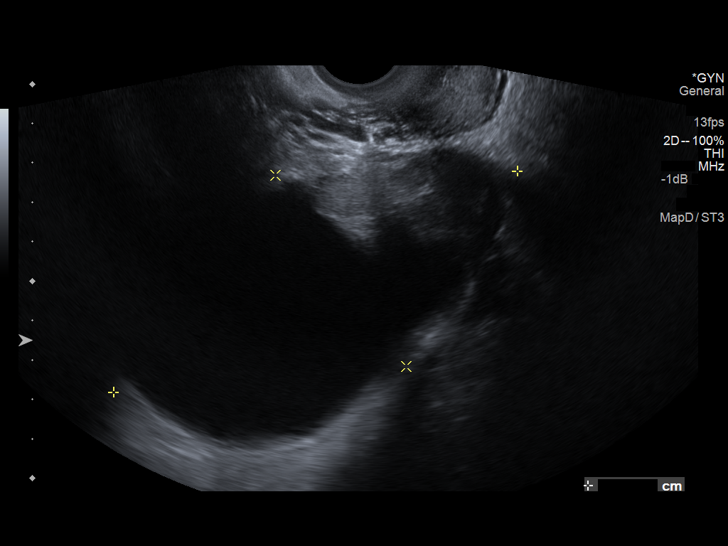
[im 78/85]
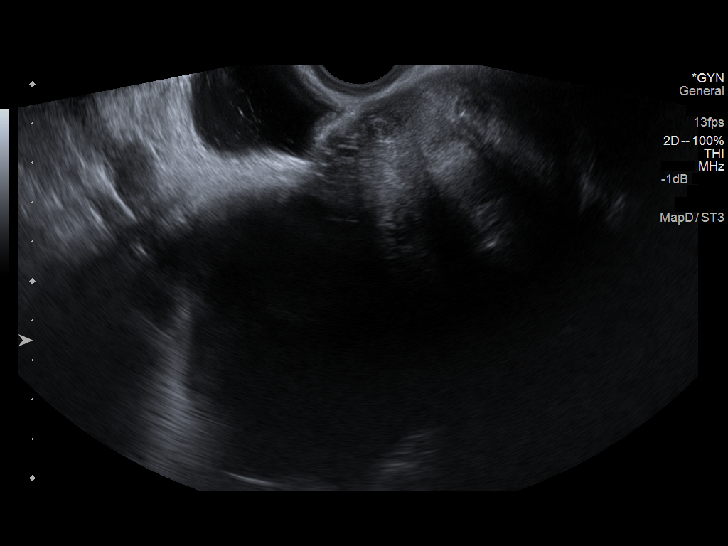
[im 85/85]
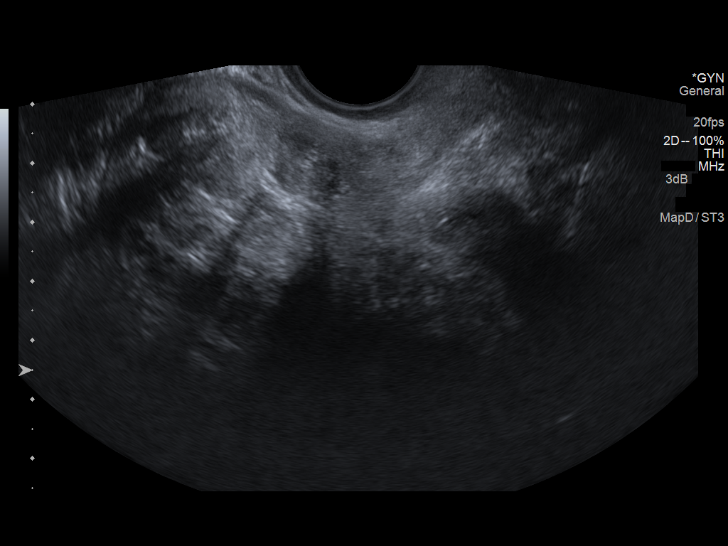

[13 of 25 positions shown; findings below may reference images not displayed]

FINDINGS: Uterus

Measurements: 9.9 x 5.5 x 6.6 cm. Multiple small heterogeneously
isoechoic to hypoechoic lesions, compatible with multifocal
fibroids. The largest of these is in the anterior aspect of the
uterine body on the left side measuring 4.7 x 3.1 x 4.4 cm. Several
of these lesions have internal areas that are very echogenic with
posterior acoustic shadowing, likely to represent internal
calcifications within the fibroids.

Endometrium

Endometrium is completely obscured by fibroids.

Right ovary

Measurements: 11.7 x 5.9 x 7.6 cm. Larger lesion measuring
approximately 9.3 x 4.9 x 6.4 cm which is anechoic with increased
through transmission, compatible with a cyst. It is uncertain
whether not there is mural nodularity, or if this is simply just
compression of the cyst wall and adjacent normal ovarian tissue.

Left ovary

Measurements: 4.9 x 3.1 x 3.9 cm. 3.3 x 2.9 x 2.9 cm anechoic lesion
with increased through transmission, compatible with a simple cyst.

Pulsed Doppler evaluation of both ovaries demonstrates normal
low-resistance arterial and venous waveforms.
IMPRESSION: 1. No findings to suggest ovarian torsion.
2. Fibroid uterus.
3. Large cyst in the right ovary measuring 9.3 x 4.9 x 6.4 cm. This
generally appears simple, however, there is at least one area of
potential mural nodularity (favored to represent some adjacent
normal ovarian tissue). However, given the size of this cyst and
potential complexity, further evaluation with nonemergent pelvic MRI
is recommended at this time to exclude potential neoplasm.

## 2014-02-07 ENCOUNTER — Other Ambulatory Visit: Payer: Self-pay | Admitting: Gynecology

## 2014-02-07 ENCOUNTER — Telehealth: Payer: Self-pay | Admitting: Emergency Medicine

## 2014-02-07 DIAGNOSIS — D249 Benign neoplasm of unspecified breast: Secondary | ICD-10-CM

## 2014-02-07 NOTE — Telephone Encounter (Signed)
Patient in recall for 01/2014. Has not completed images. Contacted patient to remind.  Message left to return call to [place] at [phone].   [facility] patient.

## 2014-02-07 NOTE — Telephone Encounter (Signed)
Spoke with patient and she states she will call today and schedule [facility] appointment for follow up.   Routing to provider for final review. Patient agreeable to disposition. Will close encounter

## 2014-02-07 NOTE — Telephone Encounter (Signed)
Message copied by Michele Mcalpine on Wed Feb 07, 2014  2:07 PM ------      Message from: Elveria Rising      Created: Mon Oct 09, 2013 11:25 AM      Regarding: FW: mammo       Due 4/15      ----- Message -----         From: Azalia Bilis, MD         Sent: 07/14/2013   9:48 PM           To: Azalia Bilis, MD      Subject: mammo                                                    Recall 10m       ------

## 2014-02-14 ENCOUNTER — Other Ambulatory Visit: Payer: BC Managed Care – PPO

## 2014-02-15 ENCOUNTER — Ambulatory Visit
Admission: RE | Admit: 2014-02-15 | Discharge: 2014-02-15 | Disposition: A | Discharge status: Home / Self Care | Payer: BC Managed Care – PPO | Source: Ambulatory Visit | Attending: Gynecology | Admitting: Gynecology

## 2014-02-15 DIAGNOSIS — D249 Benign neoplasm of unspecified breast: Secondary | ICD-10-CM

## 2014-04-04 DIAGNOSIS — E039 Hypothyroidism, unspecified: Secondary | ICD-10-CM | POA: Insufficient documentation

## 2014-05-15 ENCOUNTER — Encounter: Payer: Self-pay | Admitting: Gynecology

## 2014-05-18 ENCOUNTER — Encounter: Payer: Self-pay | Admitting: Gynecology

## 2014-05-23 ENCOUNTER — Telehealth: Payer: Self-pay | Admitting: Gynecology

## 2014-05-23 NOTE — Telephone Encounter (Signed)
Left message to call Kaitlyn at [phone]. 

## 2014-05-23 NOTE — Telephone Encounter (Signed)
Pt was talking with [name]  VIA mychart and [name] wants her to come in for appt. Nothing available anytime soon could you find pt appt  Can you come in for office visit so we can assess? Call the office and we can figure it out together. Dr. Charlies Constable   ----- Message ----- From: [name] Sent: 05/18/2014 1:57 PM EDT To: Azalia Bilis, MD Subject: Non-Urgent Medical Question  [place]!  I have been having increasing symptoms similar to what I had before my surgery- urination with increasing frequency, increased instance of loose bowels- could I be having a recurrance of the endometriosis? Should we do something different with my hormones?   Thanks! Benjamine Mola

## 2014-05-24 NOTE — Telephone Encounter (Signed)
Spoke with patient. Offered patient appointment next Monday at 8:15am. Patient declines stating that is the first day of classes. Patient states that she does not have class until 11am on Tuesdays. Requesting a Tuesday morning appointment. Advised would check scheduling and give patient a call back with an appointment date and time. Patient agreeable.

## 2014-05-25 NOTE — Telephone Encounter (Signed)
Spoke with patient. Appointment scheduled for Tuesday 8/25 at 8:15am with Dr.Lathrop. Patient agreeable to date and time.  Routing to provider for final review. Patient agreeable to disposition. Will close encounter

## 2014-05-25 NOTE — Telephone Encounter (Signed)
Left message to call [place] at [phone].   Offer patient Tuesday at 8:15am (time per TL).

## 2014-05-29 ENCOUNTER — Ambulatory Visit: Payer: BC Managed Care – PPO | Admitting: Gynecology

## 2014-05-29 ENCOUNTER — Encounter: Payer: Self-pay | Admitting: Nurse Practitioner

## 2014-05-29 ENCOUNTER — Telehealth: Payer: Self-pay | Admitting: *Deleted

## 2014-05-29 ENCOUNTER — Ambulatory Visit (INDEPENDENT_AMBULATORY_CARE_PROVIDER_SITE_OTHER): Payer: 59 | Admitting: Nurse Practitioner

## 2014-05-29 VITALS — BP 110/76 | HR 68 | Temp 98.0°F | Ht 68.0 in | Wt 245.0 lb

## 2014-05-29 DIAGNOSIS — R319 Hematuria, unspecified: Secondary | ICD-10-CM

## 2014-05-29 DIAGNOSIS — R350 Frequency of micturition: Secondary | ICD-10-CM

## 2014-05-29 LAB — POCT URINALYSIS DIPSTICK
Bilirubin, UA: NEGATIVE
Glucose, UA: NEGATIVE
Ketones, UA: NEGATIVE
LEUKOCYTES UA: NEGATIVE
Nitrite, UA: NEGATIVE
PROTEIN UA: NEGATIVE
UROBILINOGEN UA: NEGATIVE
pH, UA: 7

## 2014-05-29 NOTE — Telephone Encounter (Signed)
Left Message To Call Back Re: Seeing if she can come at 12:00 instead of her 8:15 appointment today since Dr. Charlies Constable got called into Surgery.

## 2014-05-29 NOTE — Telephone Encounter (Signed)
Patient is seeing Ms.Patty this morning at 9:45

## 2014-05-29 NOTE — Progress Notes (Signed)
S:  43 y.o.Married Caucasian female presents with complaint of urinary frequency and a decrease in urine volume.   She feels these symptms are similar to those she had before the hysterectomy.  She is concerned that endometriosis may be the cause.   She has no fever chills or dysuria.  Denies back pain.  She is on Estrace 1 mg without Prometrium. 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      Diagnostic Test:  urine with a trace of RBC's     Assessment: Follow up if symptoms not improving, and as needed. call if any fever or chills.   No medication therapy started today   Lab:  Urine C&S and MIcor   TOC if Urine Culture is positive   Continue to do Kegel exercises      She really wants to speak to Dr. Charlies Constable about endometriosis  Consult time: 13 minutes

## 2014-05-29 NOTE — Patient Instructions (Signed)
Will have Dr. Charlies Constable to call you with questions about endometriosis.

## 2014-05-30 LAB — URINALYSIS, MICROSCOPIC ONLY
Bacteria, UA: NONE SEEN
Casts: NONE SEEN
Crystals: NONE SEEN

## 2014-05-30 LAB — URINE CULTURE: Colony Count: 30000

## 2014-06-04 NOTE — Progress Notes (Signed)
Note reviewed, agree with plan.  Calyn Rubi, MD  

## 2014-06-13 ENCOUNTER — Other Ambulatory Visit: Payer: Self-pay | Admitting: Gynecology

## 2014-06-14 NOTE — Telephone Encounter (Signed)
Last AEX: 06/26/13 Last refill:08/23/13 #90 X 3 Current AEX:NS  Please advise

## 2014-06-14 NOTE — Telephone Encounter (Signed)
Needs appointment

## 2014-06-15 ENCOUNTER — Telehealth: Payer: Self-pay | Admitting: Gynecology

## 2014-06-15 NOTE — Telephone Encounter (Signed)
Patient is returning a call to [place].

## 2014-06-15 NOTE — Telephone Encounter (Signed)
Lmom to contact the office about schedule an AEX

## 2014-06-15 NOTE — Telephone Encounter (Signed)
Pt schedule an AEX for 06/19/14  Encounter closed

## 2014-06-19 ENCOUNTER — Ambulatory Visit (INDEPENDENT_AMBULATORY_CARE_PROVIDER_SITE_OTHER): Payer: 59 | Admitting: Gynecology

## 2014-06-19 ENCOUNTER — Encounter: Payer: Self-pay | Admitting: Gynecology

## 2014-06-19 VITALS — BP 120/90 | HR 74 | Resp 14 | Ht 68.0 in | Wt 240.0 lb

## 2014-06-19 DIAGNOSIS — R350 Frequency of micturition: Secondary | ICD-10-CM

## 2014-06-19 DIAGNOSIS — N801 Endometriosis of ovary: Secondary | ICD-10-CM

## 2014-06-19 DIAGNOSIS — M069 Rheumatoid arthritis, unspecified: Secondary | ICD-10-CM | POA: Insufficient documentation

## 2014-06-19 DIAGNOSIS — N80109 Endometriosis of ovary, unspecified side, unspecified depth: Secondary | ICD-10-CM

## 2014-06-19 DIAGNOSIS — Z01419 Encounter for gynecological examination (general) (routine) without abnormal findings: Secondary | ICD-10-CM

## 2014-06-19 DIAGNOSIS — N898 Other specified noninflammatory disorders of vagina: Secondary | ICD-10-CM

## 2014-06-19 DIAGNOSIS — E8940 Asymptomatic postprocedural ovarian failure: Secondary | ICD-10-CM

## 2014-06-19 DIAGNOSIS — E038 Other specified hypothyroidism: Secondary | ICD-10-CM

## 2014-06-19 DIAGNOSIS — E8941 Symptomatic postprocedural ovarian failure: Secondary | ICD-10-CM

## 2014-06-19 LAB — POCT WET PREP (WET MOUNT): Clue Cells Wet Prep Whiff POC: NEGATIVE

## 2014-06-19 MED ORDER — ESTRADIOL 1 MG PO TABS: 1.0000 mg | ORAL_TABLET | Freq: Every day | ORAL | Status: DC

## 2014-06-19 NOTE — Progress Notes (Signed)
43 y.o. Married Caucasian female   G0P0 here for annual exam. Pt is currently sexually active.  Pt is s/p robotic hysterectomy with BSO for fibroids and endometriosis, pt states states that she has done well until recently when she noticed that she is urinating more frequently, and gets up twice during the night to void.  Pt states that she was tested for UTI and was negative.  Pt recently diagnosed with RA and started mtx last week. No vaginal penetration, no diarrhea, but loose stools.  No hematuria or hematochezia.  Patient's last menstrual period was 07/01/2013.          Sexually active: Yes.    The current method of family planning is status post hysterectomy.    Exercising: No.  The patient does not participate in regular exercise at present. Last pap: 02/24/13 NEG HR HPV Alcohol: 1x/wk Tobacco:  no BSE: no Mammogram: 06/28/13  Labs: Alessandra Grout, MD   Health Maintenance  Topic Date Due  . Tetanus/tdap  04/16/1988  . Influenza Vaccine  05/05/2014  . Pap Smear  03/03/2016    Family History  Problem Relation Age of Onset  . Hypertension Mother   . Diabetes Father     Type 2  . Osteoporosis Maternal Grandmother   . Colon cancer Paternal Grandfather     Patient Active Problem List   Diagnosis Date Noted  . Rheumatoid arthritis   . Hypothyroid 04/04/2014    Past Medical History  Diagnosis Date  . Depression   . Fibroid   . Ovarian cyst, bilateral   . Asthma     seasonal  . Seasonal allergies   . GERD (gastroesophageal reflux disease)     diet controlled and otc med prn  . Endometrioma 07/24/2013  . Hypothyroid 04/2014  . Rheumatoid arthritis     Past Surgical History  Procedure Laterality Date  . Wisdom tooth extraction  30 years ago  . Robotic assisted total hysterectomy N/A 07/17/2013    Procedure: ROBOTIC ASSISTED TOTAL HYSTERECTOMY lysis of adhesions;  Surgeon: Azalia Bilis, MD;  Location: [facility];  Service: Gynecology;  Laterality: N/A;  . Cystoscopy   07/17/2013    Procedure: CYSTOSCOPY;  Surgeon: Azalia Bilis, MD;  Location: [facility];  Service: Gynecology;;  . Robotic assisted bilateral salpingo oopherectomy N/A 07/17/2013    Procedure: ROBOTIC ASSISTED BILATERAL SALPINGO OOPHORECTOMY;  Surgeon: Azalia Bilis, MD;  Location: [facility];  Service: Gynecology;  Laterality: N/A;    Allergies: Sulfa antibiotics  Current Outpatient Prescriptions  Medication Sig Dispense Refill  . albuterol (PROVENTIL HFA;VENTOLIN HFA) 108 (90 BASE) MCG/ACT inhaler Inhale 2 puffs into the lungs every 6 (six) hours as needed for wheezing.  1 Inhaler  0  . cetirizine (ZYRTEC) 10 MG tablet Take 10 mg by mouth daily.      Marland Kitchen estradiol (ESTRACE) 1 MG tablet TAKE 1 TABLET (1 MG TOTAL) BY MOUTH DAILY.  30 tablet  0  . fluticasone (FLONASE) 50 MCG/ACT nasal spray Place 2 sprays into the nose daily.      . folic acid (FOLVITE) 1 MG tablet Take 1 mg by mouth daily.      . meloxicam (MOBIC) 15 MG tablet Take 15 mg by mouth daily.      . methotrexate (RHEUMATREX) 2.5 MG tablet       . sertraline (ZOLOFT) 100 MG tablet Take 100 mg by mouth every morning.       No current facility-administered medications for this visit.  ROS: Pertinent items are noted in HPI.  Exam:    BP 120/90  Pulse 74  Resp 14  Ht 5\' 8"  (1.727 m)  Wt 240 lb (108.863 kg)  BMI 36.50 kg/m2  LMP 07/01/2013 Weight change: @WEIGHTCHANGE @ Last 3 height recordings:  Ht Readings from Last 3 Encounters:  06/19/14 5\' 8"  (1.727 m)  05/29/14 5\' 8"  (1.727 m)  08/23/13 5\' 8"  (1.727 m)   General appearance: alert, cooperative and appears stated age Head: Normocephalic, without obvious abnormality, atraumatic Neck: no adenopathy, no carotid bruit, no JVD, supple, symmetrical, trachea midline and thyroid not enlarged, symmetric, no tenderness/mass/nodules Lungs: clear to auscultation bilaterally Breasts: normal appearance, no masses or tenderness Heart: regular rate and rhythm, S1, S2 normal, no  murmur, click, rub or gallop Abdomen: soft, non-tender; bowel sounds normal; no masses,  no organomegaly Extremities: extremities normal, atraumatic, no cyanosis or edema Skin: Skin color, texture, turgor normal. No rashes or lesions Lymph nodes: Cervical, supraclavicular, and axillary nodes normal. no inguinal nodes palpated Neurologic: Grossly normal   Pelvic: External genitalia:  no lesions              Urethra: normal appearing urethra with no masses, tenderness or lesions              Bartholins and Skenes: Bartholin's, Urethra, Skene's normal                 Vagina: vaginal discharge - white, copious, creamy, odorless and thick              Cervix: absent              Pap taken: No.        Bimanual Exam:  Uterus:  absent, tenderness absent                                      Adnexa:    surgically absent bilateral                                      Rectovaginal: Confirms, no nodularity of lateral side walls, uterosacral ligaments, levators, non-tender in all                                      Anus:  normal sphincter tone, no lesions     1. Routine gynecological examination counseled on breast self exam, mammography screening, adequate intake of calcium and vitamin D, diet and exercise return annually or prn  2. Endometriosis of ovary No evidence on exam today of recurrence of endometriosis, no pelvic tenderness or nodularity, reviewed surgical pathology-no report of endometriosis on serosal surface of uterus.  3. Urinary frequency Do not suspect related to endometriosis but can not rule out entirely, pt offered to stop estrogen, trial of letrozole, off label use of duavee, elmiron or elavil.  Routine post-op scarring cannot be ruled out.  Discussed avoiding bladder irritants.  Pt elects to watch and no treatment at this time - POCT Wet Prep [facility])  4. Vaginal discharge Pt assured discharge is normal and not source of urinary issues - POCT Wet Prep [facility])  5.  Surgical menopause  - estradiol (ESTRACE) 1 MG tablet; Take 1 tablet (1 mg total) by  mouth daily.  Dispense: 90 tablet; Refill: 3  An After Visit Summary was printed and given to the patient.

## 2014-07-20 ENCOUNTER — Other Ambulatory Visit: Payer: Self-pay

## 2014-08-07 ENCOUNTER — Telehealth: Payer: Self-pay | Admitting: *Deleted

## 2014-08-07 NOTE — Telephone Encounter (Signed)
Patient is in recall for a Bilateral Diagnostic Mammogram for 6 months with right breast ultrasound from her left breast ultrasound report 02/15/14.  Left Message To Call Back to see if patient followed up.

## 2014-08-13 NOTE — Telephone Encounter (Signed)
Left Message To Call Back  

## 2014-08-20 NOTE — Telephone Encounter (Signed)
Left Message To Call Back  

## 2014-09-12 ENCOUNTER — Other Ambulatory Visit: Payer: Self-pay

## 2014-09-12 DIAGNOSIS — E8940 Asymptomatic postprocedural ovarian failure: Secondary | ICD-10-CM

## 2014-09-12 NOTE — Telephone Encounter (Signed)
Medication refill request: estradiol 1mg  Last AEX:  06/19/14 Next AEX: NS Last MMG (if hormonal medication request): 02/15/14 Bi-Rads Benign Refill authorized: 90 day X 3  New rx sent 06/19/14 to pharmacy.  Deny

## 2014-09-25 ENCOUNTER — Encounter: Payer: Self-pay | Admitting: *Deleted

## 2014-10-19 ENCOUNTER — Encounter: Payer: Self-pay | Admitting: Nurse Practitioner

## 2014-10-19 NOTE — Telephone Encounter (Addendum)
Recall Notes:  Bilateral diagnostic mammogram in 6 months with right breast ultrasound/[facility]   Last MMG:  02/15/14, ultrasound, Stable probably benign mass in the upper-outer right breast at 10:30.  Pt has been called three times without a return call.    Letter routed to Dr. Sabra Heck. Please advise recall.

## 2014-10-19 NOTE — Telephone Encounter (Signed)
Letter signed.  OK to send and out of MMG recall.

## 2014-10-24 NOTE — Telephone Encounter (Signed)
Letter mailed.  Recall completed. Closing encounter.

## 2014-11-29 ENCOUNTER — Encounter: Payer: Self-pay | Admitting: Nurse Practitioner

## 2015-02-03 ENCOUNTER — Ambulatory Visit (INDEPENDENT_AMBULATORY_CARE_PROVIDER_SITE_OTHER): Payer: 59 | Admitting: Emergency Medicine

## 2015-02-03 VITALS — BP 120/70 | HR 80 | Temp 98.6°F | Resp 16 | Ht 67.75 in | Wt 243.2 lb

## 2015-02-03 DIAGNOSIS — L02211 Cutaneous abscess of abdominal wall: Secondary | ICD-10-CM | POA: Diagnosis not present

## 2015-02-03 MED ORDER — SULFAMETHOXAZOLE-TRIMETHOPRIM 800-160 MG PO TABS: 2.0000 | ORAL_TABLET | Freq: Two times a day (BID) | ORAL | Status: DC

## 2015-02-03 NOTE — Progress Notes (Signed)
[facility] [address] [place] [address] [phone]  Date:  02/03/2015   Name:  [name]   DOB:  10/14/1970   MRN:  [id number]  PCP:  Lilian Coma, MD    Chief Complaint: Recurrent Skin Infections   History of Present Illness:  [name] is a 44 y.o. very pleasant female patient who presents with the following:  Patient has RA and is on humira and methotrexate Now to office with cellulitis of left abdominal wall No fever or chills No drainage Initially had a small pustule that has enlarged  No history of injury. No improvement with over the counter medications or other home remedies.  Denies other complaint or health concern today.   Patient Active Problem List   Diagnosis Date Noted  . Rheumatoid arthritis     Past Medical History  Diagnosis Date  . Depression   . Fibroid   . Ovarian cyst, bilateral   . Asthma     seasonal  . Seasonal allergies   . GERD (gastroesophageal reflux disease)     diet controlled and otc med prn  . Endometrioma 07/24/2013  . Rheumatoid arthritis   . Allergy     Past Surgical History  Procedure Laterality Date  . Wisdom tooth extraction  30 years ago  . Robotic assisted total hysterectomy N/A 07/17/2013    Procedure: ROBOTIC ASSISTED TOTAL HYSTERECTOMY lysis of adhesions;  Surgeon: Azalia Bilis, MD;  Location: [facility];  Service: Gynecology;  Laterality: N/A;  . Cystoscopy  07/17/2013    Procedure: CYSTOSCOPY;  Surgeon: Azalia Bilis, MD;  Location: [facility];  Service: Gynecology;;  . Robotic assisted bilateral salpingo oopherectomy N/A 07/17/2013    Procedure: ROBOTIC ASSISTED BILATERAL SALPINGO OOPHORECTOMY;  Surgeon: Azalia Bilis, MD;  Location: [facility];  Service: Gynecology;  Laterality: N/A;  . Abdominal hysterectomy      History  Substance Use Topics  . Smoking status: Never Smoker   . Smokeless tobacco: Never Used  . Alcohol Use: 0.5 oz/week    1 Standard drinks or  equivalent per week     Comment: ocassional    Family History  Problem Relation Age of Onset  . Hypertension Mother   . Diabetes Father     Type 2  . Osteoporosis Maternal Grandmother   . Colon cancer Paternal Grandfather   . Hyperlipidemia Maternal Grandfather   . Heart disease Maternal Grandfather   . Mental illness Paternal Grandmother     Allergies  Allergen Reactions  . Sulfa Antibiotics     Insomnia and headaches    Medication list has been reviewed and updated.  Current Outpatient Prescriptions on File Prior to Visit  Medication Sig Dispense Refill  . albuterol (PROVENTIL HFA;VENTOLIN HFA) 108 (90 BASE) MCG/ACT inhaler Inhale 2 puffs into the lungs every 6 (six) hours as needed for wheezing. 1 Inhaler 0  . cetirizine (ZYRTEC) 10 MG tablet Take 10 mg by mouth daily.    Marland Kitchen estradiol (ESTRACE) 1 MG tablet Take 1 tablet (1 mg total) by mouth daily. 90 tablet 3  . fluticasone (FLONASE) 50 MCG/ACT nasal spray Place 2 sprays into the nose daily.    . folic acid (FOLVITE) 1 MG tablet Take 1 mg by mouth daily.    . meloxicam (MOBIC) 15 MG tablet Take 15 mg by mouth daily.    . methotrexate (RHEUMATREX) 2.5 MG tablet     . sertraline (ZOLOFT) 100 MG tablet Take  100 mg by mouth every morning.     No current facility-administered medications on file prior to visit.    Review of Systems:  Review of Systems  Constitutional: Negative for fever, chills and fatigue.  HENT: Negative for congestion, ear pain, hearing loss, postnasal drip, rhinorrhea and sinus pressure.   Eyes: Negative for discharge and redness.  Respiratory: Negative for cough, shortness of breath and wheezing.   Cardiovascular: Negative for chest pain and leg swelling.  Gastrointestinal: Negative for nausea, vomiting, abdominal pain, constipation and blood in stool.  Genitourinary: Negative for dysuria, urgency and frequency.  Musculoskeletal: Negative for neck stiffness.  Skin: Negative for rash.   Neurological: Negative for seizures, weakness and headaches.     Physical Examination: Filed Vitals:   02/03/15 1553  BP: 120/70  Pulse: 80  Temp: 98.6 F (37 C)  Resp: 16   Filed Vitals:   02/03/15 1553  Height: 5' 7.75" (1.721 m)  Weight: 243 lb 4 oz (110.337 kg)   Body mass index is 37.25 kg/(m^2). Ideal Body Weight: Weight in (lb) to have BMI = 25: 162.9  GEN: obese, NAD, Non-toxic, A & O x 3 HEENT: Atraumatic, Normocephalic. Neck supple. No masses, No LAD. Ears and Nose: No external deformity. CV: RRR, No M/G/R. No JVD. No thrill. No extra heart sounds. PULM: CTA B, no wheezes, crackles, rhonchi. No retractions. No resp. distress. No accessory muscle use. ABD: S, NT, ND, +BS. No rebound. No HSM. EXTR: No c/c/e NEURO Normal gait.  PSYCH: Normally interactive. Conversant. Not depressed or anxious appearing.  Calm demeanor.  SKIN:  Cellulitis left lower abdominal wall.  No fluctuance but some induration.   Assessment and Plan: Cellulitis Immunocompromise Septra Says allergy is "insomnia" Local heat Red flags and action discussed.   Signed Ellison Carwin, MD

## 2015-02-03 NOTE — Patient Instructions (Signed)

## 2019-12-02 ENCOUNTER — Ambulatory Visit: Payer: Self-pay | Attending: Internal Medicine

## 2019-12-02 DIAGNOSIS — Z23 Encounter for immunization: Secondary | ICD-10-CM | POA: Insufficient documentation

## 2019-12-02 NOTE — Progress Notes (Signed)
   Covid-19 Vaccination Clinic  Name:  [name]    MRN: [id number] DOB: 1971-01-18  12/02/2019  Ms. Winiarski was observed post Covid-19 immunization for 15 minutes without incidence. She was provided with Vaccine Information Sheet and instruction to access the V-Safe system.   Ms. Schubach was instructed to call 911 with any severe reactions post vaccine: Marland Kitchen Difficulty breathing  . Swelling of your face and throat  . A fast heartbeat  . A bad rash all over your body  . Dizziness and weakness    Immunizations Administered    Name Date Dose VIS Date Route   Pfizer COVID-19 Vaccine 12/02/2019 11:27 AM 0.3 mL 09/15/2019 Intramuscular   Manufacturer: [employer], [employer]   Lot: [id number]   [place]: [id number]

## 2019-12-27 ENCOUNTER — Ambulatory Visit: Payer: Self-pay | Attending: Internal Medicine

## 2019-12-27 DIAGNOSIS — Z23 Encounter for immunization: Secondary | ICD-10-CM

## 2019-12-27 NOTE — Progress Notes (Signed)
   Covid-19 Vaccination Clinic  Name:  [name]    MRN: [id number] DOB: [birth year]  12/27/2019  [name] was observed post Covid-19 immunization for 15 minutes without incident. She was provided with Vaccine Information Sheet and instruction to access the V-Safe system.   [name] was instructed to call 911 with any severe reactions post vaccine: Marland Kitchen Difficulty breathing  . Swelling of face and throat  . A fast heartbeat  . A bad rash all over body  . Dizziness and weakness   Immunizations Administered    Name Date Dose VIS Date Route   Pfizer COVID-19 Vaccine 12/27/2019  3:20 PM 0.3 mL 09/15/2019 Intramuscular   Manufacturer: [place]   Lot: [id number]   [place]: [id number]

## 2021-04-03 ENCOUNTER — Encounter: Payer: Self-pay | Admitting: Podiatry

## 2021-04-03 ENCOUNTER — Other Ambulatory Visit: Payer: Self-pay

## 2021-04-03 ENCOUNTER — Ambulatory Visit: Payer: Managed Care, Other (non HMO) | Admitting: Podiatry

## 2021-04-03 ENCOUNTER — Ambulatory Visit (INDEPENDENT_AMBULATORY_CARE_PROVIDER_SITE_OTHER): Payer: Managed Care, Other (non HMO)

## 2021-04-03 DIAGNOSIS — M76822 Posterior tibial tendinitis, left leg: Secondary | ICD-10-CM

## 2021-04-03 DIAGNOSIS — M216X2 Other acquired deformities of left foot: Secondary | ICD-10-CM | POA: Diagnosis not present

## 2021-04-03 DIAGNOSIS — M7662 Achilles tendinitis, left leg: Secondary | ICD-10-CM | POA: Diagnosis not present

## 2021-04-03 DIAGNOSIS — M7752 Other enthesopathy of left foot: Secondary | ICD-10-CM

## 2021-04-03 DIAGNOSIS — M21862 Other specified acquired deformities of left lower leg: Secondary | ICD-10-CM

## 2021-04-03 DIAGNOSIS — M722 Plantar fascial fibromatosis: Secondary | ICD-10-CM

## 2021-04-03 DIAGNOSIS — M7750 Other enthesopathy of unspecified foot: Secondary | ICD-10-CM

## 2021-04-03 DIAGNOSIS — M9260 Juvenile osteochondrosis of tarsus, unspecified ankle: Secondary | ICD-10-CM

## 2021-04-03 MED ORDER — METHYLPREDNISOLONE 4 MG PO TBPK: ORAL_TABLET | ORAL | 0 refills | Status: DC

## 2021-04-03 NOTE — Progress Notes (Signed)
  Subjective:  Patient ID: [name], female    DOB: 1970/11/12,  MRN: [id number]  Chief Complaint  Patient presents with   Foot Pain    (xrays)(NP) LEFT FOOT HEEL PAIN/ANKLE PAIN    50 y.o. female presents with the above complaint. History confirmed with patient.  Has been going on for 2 months and worsening it hurts all the time now.  Worst in the morning and sometimes after walking around at the end of the day.  Objective:  Physical Exam: warm, good capillary refill, no trophic changes or ulcerative lesions, normal DP and PT pulses, and normal sensory exam.  She is sharp pain on palpation to the mid plantar fascia and insertion of the plantar calcaneus, the posterior tibial tendon in the retromalleolar groove and at the insertion on the navicular as well as the distal Achilles tendon mid course and calcaneal insertion.  Radiographs: Multiple views x-ray of the left foot: no fracture, dislocation, swelling or degenerative changes noted, plantar calcaneal spur, posterior calcaneal spur, and Haglund deformity noted Assessment:   1. Achilles tendinitis, left leg   2. Posterior tibial tendinitis of left lower extremity   3. Plantar fasciitis, left   4. Gastrocnemius equinus of left lower extremity      Plan:  Patient was evaluated and treated and all questions answered.  Discussed the etiology and treatment options for Achilles tendinitis posterior tibial tendinitis and plantar fasciitis including stretching, formal physical therapy with an eccentric exercises therapy plan, supportive shoegears such as a running shoe or sneaker, heel lifts, topical and oral medications.  We also discussed that I do not routinely perform injections in this area because of the risk of an increased damage or rupture of the tendon.  Discussed that we could do a plantar fasciitis injection if not improving.  She would like to wait on this and try the prednisone taper first.  She currently takes  meloxicam already.  Given the nature of multiple issues I recommend she begin physical therapy immediately.  -XR reviewed with patient -Educated on stretching and icing of the affected limb. -Referral placed to physical therapy. -Rx for Medrol 6-day taper. Advised on risks, benefits, and alternatives of the medication   Return in about 1 month (around 05/03/2021) for re-check Achilles tendon, recheck plantar fasciitis, re-check PT tendinitis.

## 2021-04-16 ENCOUNTER — Telehealth: Payer: Self-pay | Admitting: Podiatry

## 2021-04-16 DIAGNOSIS — M21862 Other specified acquired deformities of left lower leg: Secondary | ICD-10-CM

## 2021-04-16 DIAGNOSIS — M722 Plantar fascial fibromatosis: Secondary | ICD-10-CM

## 2021-04-16 DIAGNOSIS — M7662 Achilles tendinitis, left leg: Secondary | ICD-10-CM

## 2021-04-16 DIAGNOSIS — M76822 Posterior tibial tendinitis, left leg: Secondary | ICD-10-CM

## 2021-04-16 DIAGNOSIS — M216X2 Other acquired deformities of left foot: Secondary | ICD-10-CM

## 2021-04-16 NOTE — Telephone Encounter (Signed)
[facility] called and stated they needed a prescription faxed over for this patient

## 2021-04-16 NOTE — Addendum Note (Signed)
Addended bySherryle Lis, Tyler Robidoux R on: 04/16/2021 01:12 PM   Modules accepted: Orders

## 2021-05-08 ENCOUNTER — Other Ambulatory Visit: Payer: Self-pay

## 2021-05-08 ENCOUNTER — Ambulatory Visit: Payer: Managed Care, Other (non HMO) | Admitting: Podiatry

## 2021-05-08 DIAGNOSIS — M722 Plantar fascial fibromatosis: Secondary | ICD-10-CM

## 2021-05-08 DIAGNOSIS — M7662 Achilles tendinitis, left leg: Secondary | ICD-10-CM

## 2021-05-08 DIAGNOSIS — M76822 Posterior tibial tendinitis, left leg: Secondary | ICD-10-CM

## 2021-05-08 DIAGNOSIS — M216X2 Other acquired deformities of left foot: Secondary | ICD-10-CM | POA: Diagnosis not present

## 2021-05-08 DIAGNOSIS — M21862 Other specified acquired deformities of left lower leg: Secondary | ICD-10-CM

## 2021-05-08 DIAGNOSIS — M9260 Juvenile osteochondrosis of tarsus, unspecified ankle: Secondary | ICD-10-CM

## 2021-05-08 NOTE — Patient Instructions (Signed)
Work over the next 4 weeks to transition out of the CAM boot into your boots gradually. If you have too much pain, slow down the transition. I expect you'll need to wear the boot work

## 2021-05-12 NOTE — Progress Notes (Signed)
  Subjective:  Patient ID: [name], female    DOB: 1971-07-26,  MRN: [id number]  Chief Complaint  Patient presents with   Tendonitis     re-check Achilles tendon, recheck plantar fasciitis, re-check PT tendinitis    50 y.o. female presents with the above complaint. History confirmed with patient.  She did start physical therapy and has been helpful about 50% better than it was  Objective:  Physical Exam: warm, good capillary refill, no trophic changes or ulcerative lesions, normal DP and PT pulses, and normal sensory exam.  Pain along the plantar fascia PT tendon and distal Achilles tendon, improved since last visit  Radiographs: Multiple views x-ray of the left foot: no fracture, dislocation, swelling or degenerative changes noted, plantar calcaneal spur, posterior calcaneal spur, and Haglund deformity noted Assessment:   No diagnosis found.    Plan:  Patient was evaluated and treated and all questions answered.  Discussed the etiology and treatment options for Achilles tendinitis posterior tibial tendinitis and plantar fasciitis including stretching, formal physical therapy with an eccentric exercises therapy plan, supportive shoegears such as a running shoe or sneaker, heel lifts, topical and oral medications.  We also discussed that I do not routinely perform injections in this area because of the risk of an increased damage or rupture of the tendon.  Discussed that we could do a plantar fasciitis injection if not improving.  She would like to wait on this and try the prednisone taper first.  She currently takes meloxicam already.  Given the nature of multiple issues I recommend she begin physical therapy immediately.  -Overall has had some improvement continue physical therapy.  I would like to reevaluate her in 8 weeks.  Hopefully should build to discharge to home exercise at that point   Return in about 8 weeks (around 07/03/2021).

## 2021-07-03 ENCOUNTER — Ambulatory Visit: Payer: Managed Care, Other (non HMO) | Admitting: Podiatry

## 2021-07-03 ENCOUNTER — Other Ambulatory Visit: Payer: Self-pay

## 2021-07-03 DIAGNOSIS — M76822 Posterior tibial tendinitis, left leg: Secondary | ICD-10-CM | POA: Diagnosis not present

## 2021-07-03 DIAGNOSIS — M21862 Other specified acquired deformities of left lower leg: Secondary | ICD-10-CM

## 2021-07-03 DIAGNOSIS — M216X2 Other acquired deformities of left foot: Secondary | ICD-10-CM

## 2021-07-03 DIAGNOSIS — M722 Plantar fascial fibromatosis: Secondary | ICD-10-CM

## 2021-07-03 DIAGNOSIS — M7662 Achilles tendinitis, left leg: Secondary | ICD-10-CM

## 2021-07-04 NOTE — Progress Notes (Signed)
  Subjective:  Patient ID: [name], female    DOB: [birth year],  MRN: [id number]  Chief Complaint  Patient presents with   Tendonitis      re-check Achilles tendon, recheck plantar fasciitis, re-check PT tendinitis    50 y.o. female presents with the above complaint. History confirmed with patient.  She is doing well she is about 80% better.  There is still some pain in the bottom of the foot  Objective:  Physical Exam: warm, good capillary refill, no trophic changes or ulcerative lesions, normal DP and PT pulses, and normal sensory exam.  Pain in inferior heel, PT tendon and distal Achilles much improved since last visit.  Radiographs: Multiple views x-ray of the left foot: no fracture, dislocation, swelling or degenerative changes noted, plantar calcaneal spur, posterior calcaneal spur, and Haglund deformity noted Assessment:   1. Achilles tendinitis, left leg   2. Posterior tibial tendinitis of left lower extremity   3. Plantar fasciitis, left   4. Gastrocnemius equinus of left lower extremity       Plan:  Patient was evaluated and treated and all questions answered.  Overall doing much better.  She is to continue her home therapy exercises.  We discussed the option of 1 final injection into the heel to limit last bit of inflammation and she will hold off on this for now.  If is not better in about a month to 6 weeks or so she will return to see me for this.   Return if symptoms worsen or fail to improve.

## 2021-08-12 ENCOUNTER — Telehealth: Payer: Self-pay | Admitting: Physician Assistant

## 2021-08-12 DIAGNOSIS — B9689 Other specified bacterial agents as the cause of diseases classified elsewhere: Secondary | ICD-10-CM

## 2021-08-12 DIAGNOSIS — J069 Acute upper respiratory infection, unspecified: Secondary | ICD-10-CM

## 2021-08-12 MED ORDER — BENZONATATE 100 MG PO CAPS: 100.0000 mg | ORAL_CAPSULE | Freq: Three times a day (TID) | ORAL | 0 refills | Status: DC | PRN

## 2021-08-12 MED ORDER — DOXYCYCLINE HYCLATE 100 MG PO TABS: 100.0000 mg | ORAL_TABLET | Freq: Two times a day (BID) | ORAL | 0 refills | Status: DC

## 2021-08-12 NOTE — Patient Instructions (Signed)
[name], thank you for joining Leeanne Rio, PA-C for today's virtual visit.  While this provider is not your primary care provider (PCP), if your PCP is located in our provider database this encounter information will be shared with them immediately following your visit.  Consent: (Patient) [name] provided verbal consent for this virtual visit at the beginning of the encounter.  Current Medications:  Current Outpatient Medications:    albuterol (PROVENTIL HFA;VENTOLIN HFA) 108 (90 BASE) MCG/ACT inhaler, Inhale 2 puffs into the lungs every 6 (six) hours as needed for wheezing., Disp: 1 Inhaler, Rfl: 0   amphetamine-dextroamphetamine (ADDERALL XR) 10 MG 24 hr capsule, 1-2 capsules in the morning, Disp: , Rfl:    cetirizine (ZYRTEC) 10 MG tablet, Take 10 mg by mouth daily., Disp: , Rfl:    estradiol (ESTRACE) 1 MG tablet, Take 1 tablet (1 mg total) by mouth daily., Disp: 90 tablet, Rfl: 3   estradiol (ESTRACE) 1 MG tablet, Take 1 tablet by mouth daily., Disp: , Rfl:    fluticasone (FLONASE) 50 MCG/ACT nasal spray, Place 2 sprays into the nose daily., Disp: , Rfl:    folic acid (FOLVITE) 1 MG tablet, Take 1 mg by mouth daily., Disp: , Rfl:    hydroxychloroquine (PLAQUENIL) 200 MG tablet, Take 200 mg by mouth 2 (two) times daily., Disp: , Rfl:    LORazepam (ATIVAN) 0.5 MG tablet, , Disp: , Rfl:    methotrexate (RHEUMATREX) 2.5 MG tablet, , Disp: , Rfl:    methylPREDNISolone (MEDROL DOSEPAK) 4 MG TBPK tablet, 6 day dose pack - take as directed, Disp: 21 tablet, Rfl: 0   naproxen (NAPROSYN) 500 MG tablet, Take 500 mg by mouth 2 (two) times daily., Disp: , Rfl:    naproxen (NAPROSYN) 500 MG tablet, , Disp: , Rfl:    sertraline (ZOLOFT) 100 MG tablet, Take 100 mg by mouth every morning., Disp: , Rfl:    Tofacitinib Citrate (XELJANZ XR PO), , Disp: , Rfl:    traMADol (ULTRAM) 50 MG tablet, Take by mouth., Disp: , Rfl:    Medications ordered in this encounter:  No  orders of the defined types were placed in this encounter.    *If you need refills on other medications prior to your next appointment, please contact your pharmacy*  Follow-Up: Call back or seek an in-person evaluation if the symptoms worsen or if the condition fails to improve as anticipated.  Other Instructions Take antibiotic (Doxycycline) as directed.  Increase fluids.  Get plenty of rest. Use Mucinex for congestion. Use the Tessalon as directed for cough. Consider starting a nasal steroid like Flonase as directed.. Take a daily probiotic (I recommend Align or Culturelle, but even Activia Yogurt may be beneficial).  A humidifier placed in the bedroom may offer some relief for a dry, scratchy throat of nasal irritation.  Read information below on acute bronchitis. Please call or return to clinic if symptoms are not improving.  Acute Bronchitis Bronchitis is when the airways that extend from the windpipe into the lungs get red, puffy, and painful (inflamed). Bronchitis often causes thick spit (mucus) to develop. This leads to a cough. A cough is the most common symptom of bronchitis. In acute bronchitis, the condition usually begins suddenly and goes away over time (usually in 2 weeks). Smoking, allergies, and asthma can make bronchitis worse. Repeated episodes of bronchitis may cause more lung problems.  HOME CARE Rest. Drink enough fluids to keep your pee (urine) clear or pale  yellow (unless you need to limit fluids as told by your doctor). Only take over-the-counter or prescription medicines as told by your doctor. Avoid smoking and secondhand smoke. These can make bronchitis worse. If you are a smoker, think about using nicotine gum or skin patches. Quitting smoking will help your lungs heal faster. Reduce the chance of getting bronchitis again by: Washing your hands often. Avoiding people with cold symptoms. Trying not to touch your hands to your mouth, nose, or eyes. Follow up with  your doctor as told.  GET HELP IF: Your symptoms do not improve after 1 week of treatment. Symptoms include: Cough. Fever. Coughing up thick spit. Body aches. Chest congestion. Chills. Shortness of breath. Sore throat.  GET HELP RIGHT AWAY IF:  You have an increased fever. You have chills. You have severe shortness of breath. You have bloody thick spit (sputum). You throw up (vomit) often. You lose too much body fluid (dehydration). You have a severe headache. You faint.  MAKE SURE YOU:  Understand these instructions. Will watch your condition. Will get help right away if you are not doing well or get worse. Document Released: 03/09/2008 Document Revised: 05/24/2013 Document Reviewed: 03/14/2013 [facility] Patient Information 2015 [place], [place]. This information is not intended to replace advice given to you by your health care provider. Make sure you discuss any questions you have with your health care provider.    If you have been instructed to have an in-person evaluation today at a local Urgent Care facility, please use the link below. It will take you to a list of all of our available [facility], including address, phone number and hours of operation. Please do not delay care.  [facility]  If you or a family member do not have a primary care provider, use the link below to schedule a visit and establish care. When you choose a [facility] primary care physician or advanced practice provider, you gain a long-term partner in health. Find a Primary Care Provider  Learn more about [facility] in-office and virtual care options: [facility]

## 2021-08-12 NOTE — Progress Notes (Signed)
Virtual Visit Consent   [name], you are scheduled for a virtual visit with a [facility] provider today.     Just as with appointments in the office, your consent must be obtained to participate.  Your consent will be active for this visit and any virtual visit you may have with one of our providers in the next 365 days.     If you have a MyChart account, a copy of this consent can be sent to you electronically.  All virtual visits are billed to your insurance company just like a traditional visit in the office.    As this is a virtual visit, video technology does not allow for your provider to perform a traditional examination.  This may limit your provider's ability to fully assess your condition.  If your provider identifies any concerns that need to be evaluated in person or the need to arrange testing (such as labs, EKG, etc.), we will make arrangements to do so.     Although advances in technology are sophisticated, we cannot ensure that it will always work on either your end or our end.  If the connection with a video visit is poor, the visit may have to be switched to a telephone visit.  With either a video or telephone visit, we are not always able to ensure that we have a secure connection.     I need to obtain your verbal consent now.   Are you willing to proceed with your visit today?    [name] has provided verbal consent on 08/12/2021 for a virtual visit (video or telephone).   Leeanne Rio, [place]   Date: 08/12/2021 11:16 AM   Virtual Visit via Video Note   I, Leeanne Rio, connected with  [name]  ([id number], Jul 23, 1971) on 08/12/21 at 11:15 AM EST by a video-enabled telemedicine application and verified that I am speaking with the correct person using two identifiers.  Location: Patient: Virtual Visit Location Patient: Home Provider: Virtual Visit Location Provider: Home Office   I discussed the limitations of evaluation and  management by telemedicine and the availability of in person appointments. The patient expressed understanding and agreed to proceed.    History of Present Illness: [name] is a 50 y.o. who identifies as a female who was assigned female at birth, and is being seen today for several weeks of URI symptoms including chest congestion/head congestion. Notes symptoms will wax and wane but is now settled further into her chest. She notes fatigue along with her significant cough. Cough is now productive of colored sputum. Is also having bilateral frontal pressure and some maxillary sinus pain. Also noting chest tightness. Has RA and is on Xeljanz chronically. Has been taking generic Mucinex-DM and Nyquil for symptoms.   HPI: HPI  Problems:  Patient Active Problem List   Diagnosis Date Noted   Rheumatoid arthritis ([place])     Allergies:  Allergies  Allergen Reactions   Sulfa Antibiotics     Insomnia and headaches   Medications:  Current Outpatient Medications:    benzonatate (TESSALON) 100 MG capsule, Take 1 capsule (100 mg total) by mouth 3 (three) times daily as needed for cough., Disp: 30 capsule, Rfl: 0   doxycycline (VIBRA-TABS) 100 MG tablet, Take 1 tablet (100 mg total) by mouth 2 (two) times daily., Disp: 20 tablet, Rfl: 0   albuterol (PROVENTIL HFA;VENTOLIN HFA) 108 (90 BASE) MCG/ACT inhaler, Inhale 2 puffs into the lungs every  6 (six) hours as needed for wheezing., Disp: 1 Inhaler, Rfl: 0   amphetamine-dextroamphetamine (ADDERALL XR) 10 MG 24 hr capsule, 1-2 capsules in the morning, Disp: , Rfl:    cetirizine (ZYRTEC) 10 MG tablet, Take 10 mg by mouth daily., Disp: , Rfl:    estradiol (ESTRACE) 1 MG tablet, Take 1 tablet by mouth daily., Disp: , Rfl:    fluticasone (FLONASE) 50 MCG/ACT nasal spray, Place 2 sprays into the nose daily., Disp: , Rfl:    folic acid (FOLVITE) 1 MG tablet, Take 1 mg by mouth daily., Disp: , Rfl:    hydroxychloroquine (PLAQUENIL) 200 MG tablet, Take 200  mg by mouth 2 (two) times daily., Disp: , Rfl:    LORazepam (ATIVAN) 0.5 MG tablet, , Disp: , Rfl:    methotrexate (RHEUMATREX) 2.5 MG tablet, , Disp: , Rfl:    traMADol (ULTRAM) 50 MG tablet, Take by mouth., Disp: , Rfl:   Observations/Objective: Patient is well-developed, well-nourished in no acute distress.  Resting comfortably at home.  Head is normocephalic, atraumatic.  No labored breathing. Speech is clear and coherent with logical content.  Patient is alert and oriented at baseline.   Assessment and Plan: 1. Bacterial URI - doxycycline (VIBRA-TABS) 100 MG tablet; Take 1 tablet (100 mg total) by mouth 2 (two) times daily.  Dispense: 20 tablet; Refill: 0 - benzonatate (TESSALON) 100 MG capsule; Take 1 capsule (100 mg total) by mouth 3 (three) times daily as needed for cough.  Dispense: 30 capsule; Refill: 0 Rx Doxycycline.  Increase fluids.  Rest.  Saline nasal spray.  Probiotic.  Mucinex as directed.  Humidifier in bedroom. Tessalon per orders. Continue albuterol. She is still on Methotrexate so she is to call her specialist for dosing adjustments.  Call or return to clinic if symptoms are not improving.   Follow Up Instructions: I discussed the assessment and treatment plan with the patient. The patient was provided an opportunity to ask questions and all were answered. The patient agreed with the plan and demonstrated an understanding of the instructions.  A copy of instructions were sent to the patient via MyChart unless otherwise noted below.   The patient was advised to call back or seek an in-person evaluation if the symptoms worsen or if the condition fails to improve as anticipated.  Time:  I spent 11 minutes with the patient via telehealth technology discussing the above problems/concerns.    Leeanne Rio, PA-C

## 2021-11-08 ENCOUNTER — Telehealth: Payer: Managed Care, Other (non HMO) | Admitting: Nurse Practitioner

## 2021-11-08 DIAGNOSIS — U071 COVID-19: Secondary | ICD-10-CM | POA: Diagnosis not present

## 2021-11-08 MED ORDER — MOLNUPIRAVIR EUA 200MG CAPSULE: 4.0000 | ORAL_CAPSULE | Freq: Two times a day (BID) | ORAL | 0 refills | Status: AC

## 2021-11-08 NOTE — Patient Instructions (Signed)
COVID-19: Quarantine and Isolation °Quarantine °If you were exposed °Quarantine and stay away from others when you have been in close contact with someone who has COVID-19. °Isolate °If you are sick or test positive °Isolate when you are sick or when you have COVID-19, even if you don't have symptoms. °When to stay home °Calculating quarantine °The date of your exposure is considered day 0. Day 1 is the first full day after your last contact with a person who has had COVID-19. Stay home and away from other people for at least 5 days. Learn why CDC updated guidance for the general public. °IF YOU were exposed to COVID-19 and are NOT  °up to dateIF YOU were exposed to COVID-19 and are NOT on COVID-19 vaccinations °Quarantine for at least 5 days °Stay home °Stay home and quarantine for at least 5 full days. °Wear a well-fitting mask if you must be around others in your home. °Do not travel. °Get tested °Even if you don't develop symptoms, get tested at least 5 days after you last had close contact with someone with COVID-19. °After quarantine °Watch for symptoms °Watch for symptoms until 10 days after you last had close contact with someone with COVID-19. °Avoid travel °It is best to avoid travel until a full 10 days after you last had close contact with someone with COVID-19. °If you develop symptoms °Isolate immediately and get tested. Continue to stay home until you know the results. Wear a well-fitting mask around others. °Take precautions until day 10 °Wear a well-fitting mask °Wear a well-fitting mask for 10 full days any time you are around others inside your home or in public. Do not go to places where you are unable to wear a well-fitting mask. °If you must travel during days 6-10, take precautions. °Avoid being around people who are more likely to get very sick from COVID-19. °IF YOU were exposed to COVID-19 and are  °up to dateIF YOU were exposed to COVID-19 and are on COVID-19 vaccinations °No  quarantine °You do not need to stay home unless you develop symptoms. °Get tested °Even if you don't develop symptoms, get tested at least 5 days after you last had close contact with someone with COVID-19. °Watch for symptoms °Watch for symptoms until 10 days after you last had close contact with someone with COVID-19. °If you develop symptoms °Isolate immediately and get tested. Continue to stay home until you know the results. Wear a well-fitting mask around others. °Take precautions until day 10 °Wear a well-fitting mask °Wear a well-fitting mask for 10 full days any time you are around others inside your home or in public. Do not go to places where you are unable to wear a well-fitting mask. °Take precautions if traveling °Avoid being around people who are more likely to get very sick from COVID-19. °IF YOU were exposed to COVID-19 and had confirmed COVID-19 within the past 90 days (you tested positive using a viral test) °No quarantine °You do not need to stay home unless you develop symptoms. °Watch for symptoms °Watch for symptoms until 10 days after you last had close contact with someone with COVID-19. °If you develop symptoms °Isolate immediately and get tested. Continue to stay home until you know the results. Wear a well-fitting mask around others. °Take precautions until day 10 °Wear a well-fitting mask °Wear a well-fitting mask for 10 full days any time you are around others inside your home or in public. Do not go to places where you are   unable to wear a well-fitting mask. °Take precautions if traveling °Avoid being around people who are more likely to get very sick from COVID-19. °Calculating isolation °Day 0 is your first day of symptoms or a positive viral test. Day 1 is the first full day after your symptoms developed or your test specimen was collected. If you have COVID-19 or have symptoms, isolate for at least 5 days. °IF YOU tested positive for COVID-19 or have symptoms, regardless of  vaccination status °Stay home for at least 5 days °Stay home for 5 days and isolate from others in your home. °Wear a well-fitting mask if you must be around others in your home. °Do not travel. °Ending isolation if you had symptoms °End isolation after 5 full days if you are fever-free for 24 hours (without the use of fever-reducing medication) and your symptoms are improving. °Ending isolation if you did NOT have symptoms °End isolation after at least 5 full days after your positive test. °If you got very sick from COVID-19 or have a weakened immune system °You should isolate for at least 10 days. Consult your doctor before ending isolation. °Take precautions until day 10 °Wear a well-fitting mask °Wear a well-fitting mask for 10 full days any time you are around others inside your home or in public. Do not go to places where you are unable to wear a well-fitting mask. °Do not travel °Do not travel until a full 10 days after your symptoms started or the date your positive test was taken if you had no symptoms. °Avoid being around people who are more likely to get very sick from COVID-19. °Definitions °Exposure °Contact with someone infected with SARS-CoV-2, the virus that causes COVID-19, in a way that increases the likelihood of getting infected with the virus. °Close contact °A close contact is someone who was less than 6 feet away from an infected person (laboratory-confirmed or a clinical diagnosis) for a cumulative total of 15 minutes or more over a 24-hour period. For example, three individual 5-minute exposures for a total of 15 minutes. People who are exposed to someone with COVID-19 after they completed at least 5 days of isolation are not considered close contacts. °Quarantine °Quarantine is a strategy used to prevent transmission of COVID-19 by keeping people who have been in close contact with someone with COVID-19 apart from others. °Who does not need to quarantine? °If you had close contact with  someone with COVID-19 and you are in one of the following groups, you do not need to quarantine. °You are up to date with your COVID-19 vaccines. °You had confirmed COVID-19 within the last 90 days (meaning you tested positive using a viral test). °If you are up to date with COVID-19 vaccines, you should wear a well-fitting mask around others for 10 days from the date of your last close contact with someone with COVID-19 (the date of last close contact is considered day 0). Get tested at least 5 days after you last had close contact with someone with COVID-19. If you test positive or develop COVID-19 symptoms, isolate from other people and follow recommendations in the Isolation section below. If you tested positive for COVID-19 with a viral test within the previous 90 days and subsequently recovered and remain without COVID-19 symptoms, you do not need to quarantine or get tested after close contact. You should wear a well-fitting mask around others for 10 days from the date of your last close contact with someone with COVID-19 (the date of last   close contact is considered day 0). If you have COVID-19 symptoms, get tested and isolate from other people and follow recommendations in the Isolation section below. °Who should quarantine? °If you come into close contact with someone with COVID-19, you should quarantine if you are not up to date on COVID-19 vaccines. This includes people who are not vaccinated. °What to do for quarantine °Stay home and away from other people for at least 5 days (day 0 through day 5) after your last contact with a person who has COVID-19. The date of your exposure is considered day 0. Wear a well-fitting mask when around others at home, if possible. °For 10 days after your last close contact with someone with COVID-19, watch for fever (100.4°F or greater), cough, shortness of breath, or other COVID-19 symptoms. °If you develop symptoms, get tested immediately and isolate until you receive  your test results. If you test positive, follow isolation recommendations. °If you do not develop symptoms, get tested at least 5 days after you last had close contact with someone with COVID-19. °If you test negative, you can leave your home, but continue to wear a well-fitting mask when around others at home and in public until 10 days after your last close contact with someone with COVID-19. °If you test positive, you should isolate for at least 5 days from the date of your positive test (if you do not have symptoms). If you do develop COVID-19 symptoms, isolate for at least 5 days from the date your symptoms began (the date the symptoms started is day 0). Follow recommendations in the isolation section below. °If you are unable to get a test 5 days after last close contact with someone with COVID-19, you can leave your home after day 5 if you have been without COVID-19 symptoms throughout the 5-day period. Wear a well-fitting mask for 10 days after your date of last close contact when around others at home and in public. °Avoid people who are have weakened immune systems or are more likely to get very sick from COVID-19, and nursing homes and other high-risk settings, until after at least 10 days. °If possible, stay away from people you live with, especially people who are at higher risk for getting very sick from COVID-19, as well as others outside your home throughout the full 10 days after your last close contact with someone with COVID-19. °If you are unable to quarantine, you should wear a well-fitting mask for 10 days when around others at home and in public. °If you are unable to wear a mask when around others, you should continue to quarantine for 10 days. Avoid people who have weakened immune systems or are more likely to get very sick from COVID-19, and nursing homes and other high-risk settings, until after at least 10 days. °See additional information about travel. °Do not go to places where you are  unable to wear a mask, such as restaurants and some gyms, and avoid eating around others at home and at work until after 10 days after your last close contact with someone with COVID-19. °After quarantine °Watch for symptoms until 10 days after your last close contact with someone with COVID-19. °If you have symptoms, isolate immediately and get tested. °Quarantine in high-risk congregate settings °In certain congregate settings that have high risk of secondary transmission (such as correctional and detention facilities, homeless shelters, or cruise ships), CDC recommends a 10-day quarantine for residents, regardless of vaccination and booster status. During periods of critical staffing   shortages, facilities may consider shortening the quarantine period for staff to ensure continuity of operations. Decisions to shorten quarantine in these settings should be made in consultation with state, local, tribal, or territorial health departments and should take into consideration the context and characteristics of the facility. CDC's setting-specific guidance provides additional recommendations for these settings. °Isolation °Isolation is used to separate people with confirmed or suspected COVID-19 from those without COVID-19. People who are in isolation should stay home until it's safe for them to be around others. At home, anyone sick or infected should separate from others, or wear a well-fitting mask when they need to be around others. People in isolation should stay in a specific "sick room" or area and use a separate bathroom if available. Everyone who has presumed or confirmed COVID-19 should stay home and isolate from other people for at least 5 full days (day 0 is the first day of symptoms or the date of the day of the positive viral test for asymptomatic persons). They should wear a mask when around others at home and in public for an additional 5 days. People who are confirmed to have COVID-19 or are showing  symptoms of COVID-19 need to isolate regardless of their vaccination status. This includes: °People who have a positive viral test for COVID-19, regardless of whether or not they have symptoms. °People with symptoms of COVID-19, including people who are awaiting test results or have not been tested. People with symptoms should isolate even if they do not know if they have been in close contact with someone with COVID-19. °What to do for isolation °Monitor your symptoms. If you have an emergency warning sign (including trouble breathing), seek emergency medical care immediately. °Stay in a separate room from other household members, if possible. °Use a separate bathroom, if possible. °Take steps to improve ventilation at home, if possible. °Avoid contact with other members of the household and pets. °Don't share personal household items, like cups, towels, and utensils. °Wear a well-fitting mask when you need to be around other people. °Learn more about what to do if you are sick and how to notify your contacts. °Ending isolation for people who had COVID-19 and had symptoms °If you had COVID-19 and had symptoms, isolate for at least 5 days. To calculate your 5-day isolation period, day 0 is your first day of symptoms. Day 1 is the first full day after your symptoms developed. You can leave isolation after 5 full days. °You can end isolation after 5 full days if you are fever-free for 24 hours without the use of fever-reducing medication and your other symptoms have improved (Loss of taste and smell may persist for weeks or months after recovery and need not delay the end of isolation). °You should continue to wear a well-fitting mask around others at home and in public for 5 additional days (day 6 through day 10) after the end of your 5-day isolation period. If you are unable to wear a mask when around others, you should continue to isolate for a full 10 days. Avoid people who have weakened immune systems or are more  likely to get very sick from COVID-19, and nursing homes and other high-risk settings, until after at least 10 days. °If you continue to have fever or your other symptoms have not improved after 5 days of isolation, you should wait to end your isolation until you are fever-free for 24 hours without the use of fever-reducing medication and your other symptoms have improved.   Continue to wear a well-fitting mask through day 10. Contact your healthcare provider if you have questions. °See additional information about travel. °Do not go to places where you are unable to wear a mask, such as restaurants and some gyms, and avoid eating around others at home and at work until a full 10 days after your first day of symptoms. °If an individual has access to a test and wants to test, the best approach is to use an antigen test1 towards the end of the 5-day isolation period. Collect the test sample only if you are fever-free for 24 hours without the use of fever-reducing medication and your other symptoms have improved (loss of taste and smell may persist for weeks or months after recovery and need not delay the end of isolation). If your test result is positive, you should continue to isolate until day 10. If your test result is negative, you can end isolation, but continue to wear a well-fitting mask around others at home and in public until day 10. Follow additional recommendations for masking and avoiding travel as described above. °1As noted in the labeling for authorized over-the counter antigen tests: Negative results should be treated as presumptive. Negative results do not rule out SARS-CoV-2 infection and should not be used as the sole basis for treatment or patient management decisions, including infection control decisions. To improve results, antigen tests should be used twice over a three-day period with at least 24 hours and no more than 48 hours between tests. °Note that these recommendations on ending isolation  do not apply to people who are moderately ill or very sick from COVID-19 or have weakened immune systems. See section below for recommendations for when to end isolation for these groups. °Ending isolation for people who tested positive for COVID-19 but had no symptoms °If you test positive for COVID-19 and never develop symptoms, isolate for at least 5 days. Day 0 is the day of your positive viral test (based on the date you were tested) and day 1 is the first full day after the specimen was collected for your positive test. You can leave isolation after 5 full days. °If you continue to have no symptoms, you can end isolation after at least 5 days. °You should continue to wear a well-fitting mask around others at home and in public until day 10 (day 6 through day 10). If you are unable to wear a mask when around others, you should continue to isolate for 10 days. Avoid people who have weakened immune systems or are more likely to get very sick from COVID-19, and nursing homes and other high-risk settings, until after at least 10 days. °If you develop symptoms after testing positive, your 5-day isolation period should start over. Day 0 is your first day of symptoms. Follow the recommendations above for ending isolation for people who had COVID-19 and had symptoms. °See additional information about travel. °Do not go to places where you are unable to wear a mask, such as restaurants and some gyms, and avoid eating around others at home and at work until 10 days after the day of your positive test. °If an individual has access to a test and wants to test, the best approach is to use an antigen test1 towards the end of the 5-day isolation period. If your test result is positive, you should continue to isolate until day 10. If your test result is positive, you can also choose to test daily and if your test result   is negative, you can end isolation, but continue to wear a well-fitting mask around others at home and in  public until day 10. Follow additional recommendations for masking and avoiding travel as described above. °1As noted in the labeling for authorized over-the counter antigen tests: Negative results should be treated as presumptive. Negative results do not rule out SARS-CoV-2 infection and should not be used as the sole basis for treatment or patient management decisions, including infection control decisions. To improve results, antigen tests should be used twice over a three-day period with at least 24 hours and no more than 48 hours between tests. °Ending isolation for people who were moderately or very sick from COVID-19 or have a weakened immune system °People who are moderately ill from COVID-19 (experiencing symptoms that affect the lungs like shortness of breath or difficulty breathing) should isolate for 10 days and follow all other isolation precautions. To calculate your 10-day isolation period, day 0 is your first day of symptoms. Day 1 is the first full day after your symptoms developed. If you are unsure if your symptoms are moderate, talk to a healthcare provider for further guidance. °People who are very sick from COVID-19 (this means people who were hospitalized or required intensive care or ventilation support) and people who have weakened immune systems might need to isolate at home longer. They may also require testing with a viral test to determine when they can be around others. CDC recommends an isolation period of at least 10 and up to 20 days for people who were very sick from COVID-19 and for people with weakened immune systems. Consult with your healthcare provider about when you can resume being around other people. If you are unsure if your symptoms are severe or if you have a weakened immune system, talk to a healthcare provider for further guidance. °People who have a weakened immune system should talk to their healthcare provider about the potential for reduced immune responses to  COVID-19 vaccines and the need to continue to follow current prevention measures (including wearing a well-fitting mask and avoiding crowds and poorly ventilated indoor spaces) to protect themselves against COVID-19 until advised otherwise by their healthcare provider. Close contacts of immunocompromised people--including household members--should also be encouraged to receive all recommended COVID-19 vaccine doses to help protect these people. °Isolation in high-risk congregate settings °In certain high-risk congregate settings that have high risk of secondary transmission and where it is not feasible to cohort people (such as correctional and detention facilities, homeless shelters, and cruise ships), CDC recommends a 10-day isolation period for residents. During periods of critical staffing shortages, facilities may consider shortening the isolation period for staff to ensure continuity of operations. Decisions to shorten isolation in these settings should be made in consultation with state, local, tribal, or territorial health departments and should take into consideration the context and characteristics of the facility. CDC's setting-specific guidance provides additional recommendations for these settings. °This CDC guidance is meant to supplement--not replace--any federal, state, local, territorial, or tribal health and safety laws, rules, and regulations. °Recommendations for specific settings °These recommendations do not apply to healthcare professionals. For guidance specific to these settings, see °Healthcare professionals: Interim Guidance for Managing Healthcare Personnel with SARS-CoV-2 Infection or Exposure to SARS-CoV-2 °Patients, residents, and visitors to healthcare settings: Interim Infection Prevention and Control Recommendations for Healthcare Personnel During the Coronavirus Disease 2019 (COVID-19) Pandemic °Additional setting-specific guidance and recommendations are available. °These  recommendations on quarantine and isolation do apply to K[employer]12 School   settings. Additional guidance is available here: Overview of COVID-19 Quarantine for K-12 Schools °Travelers: Travel information and recommendations °Congregate facilities and other settings: guidance pages for community, work, and school settings °Ongoing COVID-19 exposure FAQs °I live with someone with COVID-19, but I cannot be separated from them. How do we manage quarantine in this situation? °It is very important for people with COVID-19 to remain apart from other people, if possible, even if they are living together. If separation of the person with COVID-19 from others that they live with is not possible, the other people that they live with will have ongoing exposure, meaning they will be repeatedly exposed until that person is no longer able to spread the virus to other people. In this situation, there are precautions you can take to limit the spread of COVID-19: °The person with COVID-19 and everyone they live with should wear a well-fitting mask inside the home. °If possible, one person should care for the person with COVID-19 to limit the number of people who are in close contact with the infected person. °Take steps to protect yourself and others to reduce transmission in the home: °Quarantine if you are not up to date with your COVID-19 vaccines. °Isolate if you are sick or tested positive for COVID-19, even if you don't have symptoms. °Learn more about the public health recommendations for testing, mask use and quarantine of close contacts, like yourself, who have ongoing exposure. These recommendations differ depending on your vaccination status. °What should I do if I have ongoing exposure to COVID-19 from someone I live with? °Recommendations for this situation depend on your vaccination status: °If you are not up to date on COVID-19 vaccines and have ongoing exposure to COVID-19, you should: °Begin quarantine immediately and  continue to quarantine throughout the isolation period of the person with COVID-19. °Continue to quarantine for an additional 5 days starting the day after the end of isolation for the person with COVID-19. °Get tested at least 5 days after the end of isolation of the infected person that lives with them. °If you test negative, you can leave the home but should continue to wear a well-fitting mask when around others at home and in public until 10 days after the end of isolation for the person with COVID-19. °Isolate immediately if you develop symptoms of COVID-19 or test positive. °If you are up to date with COVID-19 vaccines and have ongoing exposure to COVID-19, you should: °Get tested at least 5 days after your first exposure. A person with COVID-19 is considered infectious starting 2 days before they develop symptoms, or 2 days before the date of their positive test if they do not have symptoms. °Get tested again at least 5 days after the end of isolation for the person with COVID-19. °Wear a well-fitting mask when you are around the person with COVID-19, and do this throughout their isolation period. °Wear a well-fitting mask around others for 10 days after the infected person's isolation period ends. °Isolate immediately if you develop symptoms of COVID-19 or test positive. °What should I do if multiple people I live with test positive for COVID-19 at different times? °Recommendations for this situation depend on your vaccination status: °If you are not up to date with your COVID-19 vaccines, you should: °Quarantine throughout the isolation period of any infected person that you live with. °Continue to quarantine until 5 days after the end of isolation date for the most recently infected person that lives with you. For example, if   the last day of isolation of the person most recently infected with COVID-19 was June 30, the new 5-day quarantine period starts on July 1. °Get tested at least 5 days after the end  of isolation for the most recently infected person that lives with you. °Wear a well-fitting mask when you are around any person with COVID-19 while that person is in isolation. °Wear a well-fitting mask when you are around other people until 10 days after your last close contact. °Isolate immediately if you develop symptoms of COVID-19 or test positive. °If you are up to date with your COVID-19 vaccines, you should: °Get tested at least 5 days after your first exposure. A person with COVID-19 is considered infectious starting 2 days before they developed symptoms, or 2 days before the date of their positive test if they do not have symptoms. °Get tested again at least 5 days after the end of isolation for the most recently infected person that lives with you. °Wear a well-fitting mask when you are around any person with COVID-19 while that person is in isolation. °Wear a well-fitting mask around others for 10 days after the end of isolation for the most recently infected person that lives with you. For example, if the last day of isolation for the person most recently infected with COVID-19 was June 30, the new 10-day period to wear a well-fitting mask indoors in public starts on July 1. °Isolate immediately if you develop symptoms of COVID-19 or test positive. °I had COVID-19 and completed isolation. Do I have to quarantine or get tested if someone I live with gets COVID-19 shortly after I completed isolation? °No. If you recently completed isolation and someone that lives with you tests positive for the virus that causes COVID-19 shortly after the end of your isolation period, you do not have to quarantine or get tested as long as you do not develop new symptoms. Once all of the people that live together have completed isolation or quarantine, refer to the guidance below for new exposures to COVID-19. °If you had COVID-19 in the previous 90 days and then came into close contact with someone with COVID-19, you do  not have to quarantine or get tested if you do not have symptoms. But you should: °Wear a well-fitting mask indoors in public for 10 days after your last close contact. °Monitor for COVID-19 symptoms for 10 days from the date of your last close contact. °Isolate immediately and get tested if symptoms develop. °If more than 90 days have passed since your recovery from infection, follow CDC's recommendations for close contacts. These recommendations will differ depending on your vaccination status. °01/01/2021 °Content source: National Center for Immunization and Respiratory Diseases (NCIRD), Division of Viral Diseases °This information is not intended to replace advice given to you by your health care provider. Make sure you discuss any questions you have with your health care provider. °Document Revised: 05/07/2021 Document Reviewed: 05/07/2021 °Elsevier Patient Education © 2022 [employer] Inc. ° °

## 2021-11-08 NOTE — Progress Notes (Signed)
Virtual Visit Consent   [name], you are scheduled for a virtual visit with Mary-Margaret Hassell Done, [place], a [facility] provider, today.     Just as with appointments in the office, your consent must be obtained to participate.  Your consent will be active for this visit and any virtual visit you may have with one of our providers in the next 365 days.     If you have a MyChart account, a copy of this consent can be sent to you electronically.  All virtual visits are billed to your insurance company just like a traditional visit in the office.    As this is a virtual visit, video technology does not allow for your provider to perform a traditional examination.  This may limit your provider's ability to fully assess your condition.  If your provider identifies any concerns that need to be evaluated in person or the need to arrange testing (such as labs, EKG, etc.), we will make arrangements to do so.     Although advances in technology are sophisticated, we cannot ensure that it will always work on either your end or our end.  If the connection with a video visit is poor, the visit may have to be switched to a telephone visit.  With either a video or telephone visit, we are not always able to ensure that we have a secure connection.     I need to obtain your verbal consent now.   Are you willing to proceed with your visit today? YES   [name] has provided verbal consent on 11/08/2021 for a virtual visit (video or telephone).   Mary-Margaret Hassell Done, FNP   Date: 11/08/2021 11:58 AM   Virtual Visit via Video Note   I, Mary-Margaret Hassell Done, connected with [name] ([id number], [birth year]) on 11/08/21 at 12:15 PM EST by a video-enabled telemedicine application and verified that I am speaking with the correct person using two identifiers.  Location: Patient: Virtual Visit Location Patient: Home Provider: Virtual Visit Location Provider: Mobile   I discussed the  limitations of evaluation and management by telemedicine and the availability of in person appointments. The patient expressed understanding and agreed to proceed.    History of Present Illness: [name] is a 51 y.o. who identifies as a female who was assigned female at birth, and is being seen today for covid positive. Marland Kitchen  HPI: URI  This is a new problem. The current episode started yesterday. The problem has been gradually worsening. The maximum temperature recorded prior to her arrival was 100.4 - 100.9 F. The fever has been present for 1 to 2 days. Associated symptoms include congestion, coughing, headaches, rhinorrhea and a sore throat. Pertinent negatives include no sinus pain. She has tried decongestant and acetaminophen for the symptoms. The treatment provided mild relief.  Tested positive for covid yesterday. Review of Systems  HENT:  Positive for congestion, rhinorrhea and sore throat. Negative for sinus pain.   Respiratory:  Positive for cough.   Neurological:  Positive for headaches.   Problems:  Patient Active Problem List   Diagnosis Date Noted   Rheumatoid arthritis ([place])     Allergies:  Allergies  Allergen Reactions   Sulfa Antibiotics     Insomnia and headaches   Medications:  Current Outpatient Medications:    albuterol (PROVENTIL HFA;VENTOLIN HFA) 108 (90 BASE) MCG/ACT inhaler, Inhale 2 puffs into the lungs every 6 (six) hours as needed for wheezing., Disp: 1 Inhaler, Rfl:  0   amphetamine-dextroamphetamine (ADDERALL XR) 10 MG 24 hr capsule, 1-2 capsules in the morning, Disp: , Rfl:    benzonatate (TESSALON) 100 MG capsule, Take 1 capsule (100 mg total) by mouth 3 (three) times daily as needed for cough., Disp: 30 capsule, Rfl: 0   cetirizine (ZYRTEC) 10 MG tablet, Take 10 mg by mouth daily., Disp: , Rfl:    doxycycline (VIBRA-TABS) 100 MG tablet, Take 1 tablet (100 mg total) by mouth 2 (two) times daily., Disp: 20 tablet, Rfl: 0   estradiol (ESTRACE) 1 MG  tablet, Take 1 tablet by mouth daily., Disp: , Rfl:    fluticasone (FLONASE) 50 MCG/ACT nasal spray, Place 2 sprays into the nose daily., Disp: , Rfl:    folic acid (FOLVITE) 1 MG tablet, Take 1 mg by mouth daily., Disp: , Rfl:    hydroxychloroquine (PLAQUENIL) 200 MG tablet, Take 200 mg by mouth 2 (two) times daily., Disp: , Rfl:    LORazepam (ATIVAN) 0.5 MG tablet, , Disp: , Rfl:    methotrexate (RHEUMATREX) 2.5 MG tablet, , Disp: , Rfl:    traMADol (ULTRAM) 50 MG tablet, Take by mouth., Disp: , Rfl:   Observations/Objective: Patient is well-developed, well-nourished in no acute distress.  Resting comfortably  at home.  Head is normocephalic, atraumatic.  No labored breathing.  Speech is clear and coherent with logical content.  Patient is alert and oriented at baseline.    Assessment and Plan:  [name] in today with chief complaint of Covid Positive (/)   1. Positive self-administered antigen test for COVID-19 1. Take meds as prescribed 2. Use a cool mist humidifier especially during the winter months and when heat has been humid. 3. Use saline nose sprays frequently 4. Saline irrigations of the nose can be very helpful if done frequently.  * 4X daily for 1 week*  * Use of a nettie pot can be helpful with this. Follow directions with this* 5. Drink plenty of fluids 6. Keep thermostat turn down low 7.For any cough or congestion- delsym 8. For fever or aces or pains- take tylenol or ibuprofen appropriate for age and weight.  * for fevers greater than 101 orally you may alternate ibuprofen and tylenol every  3 hours.    Meds ordered this encounter  Medications   molnupiravir EUA (LAGEVRIO) 200 mg CAPS capsule    Sig: Take 4 capsules (800 mg total) by mouth 2 (two) times daily for 5 days.    Dispense:  40 capsule    Refill:  0    Order Specific Question:   Supervising Provider    Answer:   Noemi Chapel [[id number]]    Hold methotrexate dose until  tuesday    Follow Up Instructions: I discussed the assessment and treatment plan with the patient. The patient was provided an opportunity to ask questions and all were answered. The patient agreed with the plan and demonstrated an understanding of the instructions.  A copy of instructions were sent to the patient via MyChart.  The patient was advised to call back or seek an in-person evaluation if the symptoms worsen or if the condition fails to improve as anticipated.  Time:  I spent 8 minutes with the patient via telehealth technology discussing the above problems/concerns.    Mary-Margaret Hassell Done, FNP

## 2022-02-06 ENCOUNTER — Telehealth: Payer: Managed Care, Other (non HMO) | Admitting: Family

## 2022-02-06 DIAGNOSIS — J019 Acute sinusitis, unspecified: Secondary | ICD-10-CM | POA: Diagnosis not present

## 2022-02-06 MED ORDER — AMOXICILLIN-POT CLAVULANATE 875-125 MG PO TABS: 1.0000 | ORAL_TABLET | Freq: Two times a day (BID) | ORAL | 0 refills | Status: DC

## 2022-02-06 NOTE — Progress Notes (Signed)
?Virtual Visit Consent  ? ?[name], you are scheduled for a virtual visit with a [facility] provider today. Just as with appointments in the office, your consent must be obtained to participate. Your consent will be active for this visit and any virtual visit you may have with one of our providers in the next 365 days. If you have a MyChart account, a copy of this consent can be sent to you electronically. ? ?As this is a virtual visit, video technology does not allow for your provider to perform a traditional examination. This may limit your provider's ability to fully assess your condition. If your provider identifies any concerns that need to be evaluated in person or the need to arrange testing (such as labs, EKG, etc.), we will make arrangements to do so. Although advances in technology are sophisticated, we cannot ensure that it will always work on either your end or our end. If the connection with a video visit is poor, the visit may have to be switched to a telephone visit. With either a video or telephone visit, we are not always able to ensure that we have a secure connection. ? ?By engaging in this virtual visit, you consent to the provision of healthcare and authorize for your insurance to be billed (if applicable) for the services provided during this visit. Depending on your insurance coverage, you may receive a charge related to this service. ? ?I need to obtain your verbal consent now. Are you willing to proceed with your visit today? [name] has provided verbal consent on 02/06/2022 for a virtual visit (video or telephone). [name], FNP ? ?Date: 02/06/2022 12:06 PM ? ?Virtual Visit via Video Note  ? ?IEvelina Dun, connected with  [name]  ([id number], 04-08-1971) on 02/06/22 at 12:15 PM EDT by a video-enabled telemedicine application and verified that I am speaking with the correct person using two identifiers. ? ?Location: ?Patient: Virtual Visit Location  Patient: Home ?Provider: Virtual Visit Location Provider: Home Office ?  ?I discussed the limitations of evaluation and management by telemedicine and the availability of in person appointments. The patient expressed understanding and agreed to proceed.   ? ?History of Present Illness: ?[name] is a 51 y.o. who identifies as a female who was assigned female at birth, and is being seen today for sinus problems. Has taken a COVID test that was negative.  ? ?HPI: Cough ?This is a new problem. The current episode started 1 to 4 weeks ago. The problem has been gradually worsening. The problem occurs every few minutes. The cough is Productive of sputum. Associated symptoms include ear congestion, ear pain, headaches, nasal congestion, postnasal drip, rhinorrhea, a sore throat, shortness of breath and wheezing. Pertinent negatives include no chills, fever or myalgias. She has tried rest and OTC cough suppressant for the symptoms. The treatment provided mild relief.   ?Problems:  ?Patient Active Problem List  ? Diagnosis Date Noted  ? Rheumatoid arthritis ([place])   ?  ?Allergies:  ?Allergies  ?Allergen Reactions  ? Sulfa Antibiotics   ?  Insomnia and headaches  ? ?Medications:  ?Current Outpatient Medications:  ?  amoxicillin-clavulanate (AUGMENTIN) 875-125 MG tablet, Take 1 tablet by mouth 2 (two) times daily., Disp: 14 tablet, Rfl: 0 ?  albuterol (PROVENTIL HFA;VENTOLIN HFA) 108 (90 BASE) MCG/ACT inhaler, Inhale 2 puffs into the lungs every 6 (six) hours as needed for wheezing., Disp: 1 Inhaler, Rfl: 0 ?  amphetamine-dextroamphetamine (ADDERALL XR) 10  MG 24 hr capsule, 1-2 capsules in the morning, Disp: , Rfl:  ?  cetirizine (ZYRTEC) 10 MG tablet, Take 10 mg by mouth daily., Disp: , Rfl:  ?  estradiol (ESTRACE) 1 MG tablet, Take 1 tablet by mouth daily., Disp: , Rfl:  ?  fluticasone (FLONASE) 50 MCG/ACT nasal spray, Place 2 sprays into the nose daily., Disp: , Rfl:  ?  folic acid (FOLVITE) 1 MG tablet, Take 1 mg  by mouth daily., Disp: , Rfl:  ?  hydroxychloroquine (PLAQUENIL) 200 MG tablet, Take 200 mg by mouth 2 (two) times daily., Disp: , Rfl:  ?  LORazepam (ATIVAN) 0.5 MG tablet, , Disp: , Rfl:  ?  methotrexate (RHEUMATREX) 2.5 MG tablet, , Disp: , Rfl:  ?  traMADol (ULTRAM) 50 MG tablet, Take by mouth., Disp: , Rfl:  ? ?Observations/Objective: ?Patient is well-developed, well-nourished in no acute distress.  ?Resting comfortably  at home.  ?Head is normocephalic, atraumatic.  ?No labored breathing. ?Speech is clear and coherent with logical content.  ?Patient is alert and oriented at baseline.  ?Nasal congestion ?Sinus pain in maxillary  sinus  ? ?Assessment and Plan: ?1. Acute sinusitis, recurrence not specified, unspecified location ?- amoxicillin-clavulanate (AUGMENTIN) 875-125 MG tablet; Take 1 tablet by mouth 2 (two) times daily.  Dispense: 14 tablet; Refill: 0 ? ?- Take meds as prescribed ?- Use a cool mist humidifier  ?-Use saline nose sprays frequently ?-Force fluids ?-For any cough or congestion ? Use plain Mucinex- regular strength or max strength is fine ?-For fever or aces or pains- take tylenol or ibuprofen. ?-Throat lozenges if help ?-New toothbrush in 3 days ? ? ? ?Follow Up Instructions: ?I discussed the assessment and treatment plan with the patient. The patient was provided an opportunity to ask questions and all were answered. The patient agreed with the plan and demonstrated an understanding of the instructions.  A copy of instructions were sent to the patient via MyChart unless otherwise noted below.  ? ? ?The patient was advised to call back or seek an in-person evaluation if the symptoms worsen or if the condition fails to improve as anticipated. ? ?Time:  ?I spent 8 minutes with the patient via telehealth technology discussing the above problems/concerns.   ? ?[name], FNP ? ?

## 2022-02-08 ENCOUNTER — Telehealth: Payer: Managed Care, Other (non HMO) | Admitting: Nurse Practitioner

## 2022-02-08 DIAGNOSIS — J208 Acute bronchitis due to other specified organisms: Secondary | ICD-10-CM | POA: Diagnosis not present

## 2022-02-08 MED ORDER — BENZONATATE 200 MG PO CAPS: 200.0000 mg | ORAL_CAPSULE | Freq: Two times a day (BID) | ORAL | 0 refills | Status: DC | PRN

## 2022-02-08 MED ORDER — PREDNISONE 20 MG PO TABS: 20.0000 mg | ORAL_TABLET | Freq: Every day | ORAL | 0 refills | Status: AC

## 2022-02-08 NOTE — Progress Notes (Signed)
?Virtual Visit Consent  ? ?[name], you are scheduled for a virtual visit with a [facility] provider today. Just as with appointments in the office, your consent must be obtained to participate. Your consent will be active for this visit and any virtual visit you may have with one of our providers in the next 365 days. If you have a MyChart account, a copy of this consent can be sent to you electronically. ? ?As this is a virtual visit, video technology does not allow for your provider to perform a traditional examination. This may limit your provider's ability to fully assess your condition. If your provider identifies any concerns that need to be evaluated in person or the need to arrange testing (such as labs, EKG, etc.), we will make arrangements to do so. Although advances in technology are sophisticated, we cannot ensure that it will always work on either your end or our end. If the connection with a video visit is poor, the visit may have to be switched to a telephone visit. With either a video or telephone visit, we are not always able to ensure that we have a secure connection. ? ?By engaging in this virtual visit, you consent to the provision of healthcare and authorize for your insurance to be billed (if applicable) for the services provided during this visit. Depending on your insurance coverage, you may receive a charge related to this service. ? ?I need to obtain your verbal consent now. Are you willing to proceed with your visit today? MAILEY LANDSTROM has provided verbal consent on 02/08/2022 for a virtual visit (video or telephone). Gildardo Pounds, NP ? ?Date: 02/08/2022 11:04 AM ? ?Virtual Visit via Video Note  ? ?IGildardo Pounds, connected with  YANET BALLIET  ([id number], Oct 23, 1970) on 02/08/22 at 10:30 AM EDT by a video-enabled telemedicine application and verified that I am speaking with the correct person using two identifiers. ? ?Location: ?Patient: Virtual Visit Location  Patient: Home ?Provider: Virtual Visit Location Provider: Home Office ?  ?I discussed the limitations of evaluation and management by telemedicine and the availability of in person appointments. The patient expressed understanding and agreed to proceed.   ? ?History of Present Illness: ?[name] is a 51 y.o. who identifies as a female who was assigned female at birth, and is being seen today for persistent cough. ? ?Treated with augmentin for sinusitis 2 days ago via virtual visit. She states prior to being treated a few days ago she was experiencing sinusitis symptoms along with productive cough for a few weeks. The cough seems to be worse at night with recumbent positions. Despite taking antibiotics she feels her cough is relentless.  ? ? ?Problems:  ?Patient Active Problem List  ? Diagnosis Date Noted  ? Rheumatoid arthritis ([place])   ?  ?Allergies:  ?Allergies  ?Allergen Reactions  ? Sulfa Antibiotics   ?  Insomnia and headaches  ? ?Medications:  ?Current Outpatient Medications:  ?  benzonatate (TESSALON) 200 MG capsule, Take 1 capsule (200 mg total) by mouth 2 (two) times daily as needed for cough., Disp: 20 capsule, Rfl: 0 ?  predniSONE (DELTASONE) 20 MG tablet, Take 1 tablet (20 mg total) by mouth daily with breakfast for 5 days., Disp: 5 tablet, Rfl: 0 ?  albuterol (PROVENTIL HFA;VENTOLIN HFA) 108 (90 BASE) MCG/ACT inhaler, Inhale 2 puffs into the lungs every 6 (six) hours as needed for wheezing., Disp: 1 Inhaler, Rfl: 0 ?  amoxicillin-clavulanate (AUGMENTIN)  875-125 MG tablet, Take 1 tablet by mouth 2 (two) times daily., Disp: 14 tablet, Rfl: 0 ?  amphetamine-dextroamphetamine (ADDERALL XR) 10 MG 24 hr capsule, 1-2 capsules in the morning, Disp: , Rfl:  ?  cetirizine (ZYRTEC) 10 MG tablet, Take 10 mg by mouth daily., Disp: , Rfl:  ?  estradiol (ESTRACE) 1 MG tablet, Take 1 tablet by mouth daily., Disp: , Rfl:  ?  fluticasone (FLONASE) 50 MCG/ACT nasal spray, Place 2 sprays into the nose daily.,  Disp: , Rfl:  ?  folic acid (FOLVITE) 1 MG tablet, Take 1 mg by mouth daily., Disp: , Rfl:  ?  hydroxychloroquine (PLAQUENIL) 200 MG tablet, Take 200 mg by mouth 2 (two) times daily., Disp: , Rfl:  ?  LORazepam (ATIVAN) 0.5 MG tablet, , Disp: , Rfl:  ?  methotrexate (RHEUMATREX) 2.5 MG tablet, , Disp: , Rfl:  ?  traMADol (ULTRAM) 50 MG tablet, Take by mouth., Disp: , Rfl:  ? ?Observations/Objective: ?Patient is well-developed, well-nourished in no acute distress.  ?Resting comfortably at home.  ?Head is normocephalic, atraumatic.  ?No labored breathing.  ?Speech is clear and coherent with logical content.  ?Patient is alert and oriented at baseline.  ? ? ?Assessment and Plan: ?1. Viral bronchitis ?- predniSONE (DELTASONE) 20 MG tablet; Take 1 tablet (20 mg total) by mouth daily with breakfast for 5 days.  Dispense: 5 tablet; Refill: 0 ?- benzonatate (TESSALON) 200 MG capsule; Take 1 capsule (200 mg total) by mouth 2 (two) times daily as needed for cough.  Dispense: 20 capsule; Refill: 0 ? ?INSTRUCTIONS: use a humidifier for nasal congestion ?Drink plenty of fluids, rest and wash hands frequently to avoid the spread of infection ?Alternate tylenol and Motrin for relief of fever  ? ?Follow Up Instructions: ?I discussed the assessment and treatment plan with the patient. The patient was provided an opportunity to ask questions and all were answered. The patient agreed with the plan and demonstrated an understanding of the instructions.  A copy of instructions were sent to the patient via MyChart unless otherwise noted below.  ? ? ?The patient was advised to call back or seek an in-person evaluation if the symptoms worsen or if the condition fails to improve as anticipated. ? ?Time:  ?I spent 12 minutes with the patient via telehealth technology discussing the above problems/concerns.   ? ?Gildardo Pounds, NP  ?

## 2022-05-06 ENCOUNTER — Encounter: Payer: Self-pay | Admitting: Podiatry

## 2022-05-06 ENCOUNTER — Ambulatory Visit: Payer: Managed Care, Other (non HMO) | Admitting: Podiatry

## 2022-05-06 DIAGNOSIS — L600 Ingrowing nail: Secondary | ICD-10-CM

## 2022-05-06 NOTE — Patient Instructions (Signed)

## 2022-05-06 NOTE — Progress Notes (Signed)
Subjective:   Patient ID: [name], female   DOB: 51 y.o.   MRN: [id number]   HPI Patient presents with painful fourth digit left foot with thick nail bed that is painful when pressed and make shoe gear difficult.  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  Patient wants this done and I did explain permanent procedure and patient wants the surgery and today I infiltrated the fourth digit 60 mg like Marcaine mixture sterile prep done using sterile instrumentation remove the nailbed exposed matrix and applied phenol for applications 30 seconds followed by alcohol lavage sterile dressing gave instructions on soaks and reappoint.  Encouraged to leave dressing on 24 hours but take it off earlier if throbbing were to occur and encouraged her to call questions concerns

## 2022-05-12 ENCOUNTER — Telehealth: Payer: Self-pay

## 2022-05-12 MED ORDER — CEPHALEXIN 500 MG PO CAPS: 500.0000 mg | ORAL_CAPSULE | Freq: Three times a day (TID) | ORAL | 0 refills | Status: AC

## 2022-05-13 NOTE — Telephone Encounter (Signed)
Patient is aware and verbalized understanding of information given. 

## 2022-10-03 ENCOUNTER — Telehealth: Payer: Managed Care, Other (non HMO) | Admitting: Physician Assistant

## 2022-10-03 DIAGNOSIS — N3000 Acute cystitis without hematuria: Secondary | ICD-10-CM

## 2022-10-03 MED ORDER — NITROFURANTOIN MONOHYD MACRO 100 MG PO CAPS: 100.0000 mg | ORAL_CAPSULE | Freq: Two times a day (BID) | ORAL | 0 refills | Status: AC

## 2022-10-03 NOTE — Progress Notes (Signed)
E-Visit for Urinary Problems  We are sorry that you are not feeling well.  Here is how we plan to help!  Based on what you shared with me it looks like you most likely have a simple urinary tract infection.  A UTI (Urinary Tract Infection) is a bacterial infection of the bladder.  Most cases of urinary tract infections are simple to treat but a key part of your care is to encourage you to drink plenty of fluids and watch your symptoms carefully.  I have prescribed MacroBid 100 mg twice a day for 5 days.  Your symptoms should gradually improve. Call us if the burning in your urine worsens, you develop worsening fever, back pain or pelvic pain or if your symptoms do not resolve after completing the antibiotic.  Urinary tract infections can be prevented by drinking plenty of water to keep your body hydrated.  Also be sure when you wipe, wipe from front to back and don't hold it in!  If possible, empty your bladder every 4 hours.  HOME CARE Drink plenty of fluids Compete the full course of the antibiotics even if the symptoms resolve Remember, when you need to go.go. Holding in your urine can increase the likelihood of getting a UTI! GET HELP RIGHT AWAY IF: You cannot urinate You get a high fever Worsening back pain occurs You see blood in your urine You feel sick to your stomach or throw up You feel like you are going to pass out  MAKE SURE YOU  Understand these instructions. Will watch your condition. Will get help right away if you are not doing well or get worse.   Thank you for choosing an e-visit.  Your e-visit answers were reviewed by a board certified advanced clinical practitioner to complete your personal care plan. Depending upon the condition, your plan could have included both over the counter or prescription medications.  Please review your pharmacy choice. Make sure the pharmacy is open so you can pick up prescription now. If there is a problem, you may contact your  provider through MyChart messaging and have the prescription routed to another pharmacy.  Your safety is important to us. If you have drug allergies check your prescription carefully.   For the next 24 hours you can use MyChart to ask questions about today's visit, request a non-urgent call back, or ask for a work or school excuse. You will get an email in the next two days asking about your experience. I hope that your e-visit has been valuable and will speed your recovery.   I have spent 5 minutes in review of e-visit questionnaire, review and updating patient chart, medical decision making and response to patient.   Palmina Clodfelter Z Ward, PA-C    

## 2022-11-01 ENCOUNTER — Telehealth: Payer: Self-pay | Admitting: Urgent Care

## 2022-11-01 DIAGNOSIS — J069 Acute upper respiratory infection, unspecified: Secondary | ICD-10-CM

## 2022-11-01 MED ORDER — IPRATROPIUM BROMIDE 0.03 % NA SOLN: 2.0000 | Freq: Two times a day (BID) | NASAL | 0 refills | Status: AC

## 2022-11-01 MED ORDER — AMOXICILLIN-POT CLAVULANATE 875-125 MG PO TABS: 1.0000 | ORAL_TABLET | Freq: Two times a day (BID) | ORAL | 0 refills | Status: AC

## 2022-11-01 MED ORDER — BENZONATATE 100 MG PO CAPS: 100.0000 mg | ORAL_CAPSULE | Freq: Two times a day (BID) | ORAL | 0 refills | Status: AC | PRN

## 2022-11-01 NOTE — Progress Notes (Signed)
We are sorry that you are not feeling well.  Here is how we plan to help!  Based on your presentation I believe you most likely have A cough due to bacteria.  When patients have a fever and a productive cough with a change in color or increased sputum production, we are concerned about bacterial bronchitis.  If left untreated it can progress to pneumonia.  If your symptoms do not improve with your treatment plan it is important that you contact your provider.   I have prescribed Augmentin 875 mg twice a day for 10 days. Take with food.   In addition you may use A prescription cough medication called Tessalon Perles '100mg'$ . You may take 1-2 capsules every 8 hours as needed for your cough.  Additionally, I have sent in Atrovent nasal spray to help with rhinorrhea and sneezing.  From your responses in the eVisit questionnaire you describe inflammation in the upper respiratory tract which is causing a significant cough.  This is commonly called Bronchitis and has four common causes:   Allergies Viral Infections Acid Reflux Bacterial Infection Allergies, viruses and acid reflux are treated by controlling symptoms or eliminating the cause. An example might be a cough caused by taking certain blood pressure medications. You stop the cough by changing the medication. Another example might be a cough caused by acid reflux. Controlling the reflux helps control the cough.  USE OF BRONCHODILATOR ("RESCUE") INHALERS: There is a risk from using your bronchodilator too frequently.  The risk is that over-reliance on a medication which only relaxes the muscles surrounding the breathing tubes can reduce the effectiveness of medications prescribed to reduce swelling and congestion of the tubes themselves.  Although you feel brief relief from the bronchodilator inhaler, your asthma may actually be worsening with the tubes becoming more swollen and filled with mucus.  This can delay other crucial treatments, such as oral  steroid medications. If you need to use a bronchodilator inhaler daily, several times per day, you should discuss this with your provider.  There are probably better treatments that could be used to keep your asthma under control.     HOME CARE Only take medications as instructed by your medical team. Complete the entire course of an antibiotic. Drink plenty of fluids and get plenty of rest. Avoid close contacts especially the very young and the elderly Cover your mouth if you cough or cough into your sleeve. Always remember to wash your hands A steam or ultrasonic humidifier can help congestion.   GET HELP RIGHT AWAY IF: You develop worsening fever. You become short of breath You cough up blood. Your symptoms persist after you have completed your treatment plan MAKE SURE YOU  Understand these instructions. Will watch your condition. Will get help right away if you are not doing well or get worse.    Thank you for choosing an e-visit.  Your e-visit answers were reviewed by a board certified advanced clinical practitioner to complete your personal care plan. Depending upon the condition, your plan could have included both over the counter or prescription medications.  Please review your pharmacy choice. Make sure the pharmacy is open so you can pick up prescription now. If there is a problem, you may contact your provider through [employer] and have the prescription routed to another pharmacy.  Your safety is important to [place]. If you have drug allergies check your prescription carefully.   For the next 24 hours you can use MyChart to ask questions  about today's visit, request a non-urgent call back, or ask for a work or school excuse. You will get an email in the next two days asking about your experience. I hope that your e-visit has been valuable and will speed your recovery.  I have spent 5 minutes in review of e-visit questionnaire, review and updating patient chart, medical  decision making and response to patient.   Roper, PA

## 2022-11-27 ENCOUNTER — Telehealth: Payer: Managed Care, Other (non HMO) | Admitting: Physician Assistant

## 2022-11-27 DIAGNOSIS — B9689 Other specified bacterial agents as the cause of diseases classified elsewhere: Secondary | ICD-10-CM | POA: Diagnosis not present

## 2022-11-27 DIAGNOSIS — J019 Acute sinusitis, unspecified: Secondary | ICD-10-CM

## 2022-11-27 MED ORDER — DOXYCYCLINE HYCLATE 100 MG PO TABS: 100.0000 mg | ORAL_TABLET | Freq: Two times a day (BID) | ORAL | 0 refills | Status: DC

## 2022-11-27 NOTE — Progress Notes (Signed)

## 2022-11-27 NOTE — Progress Notes (Signed)
I have spent 5 minutes in review of e-visit questionnaire, review and updating patient chart, medical decision making and response to patient.   Jacia Sickman Cody Jerica Creegan, PA-C    

## 2023-08-23 ENCOUNTER — Emergency Department (HOSPITAL_COMMUNITY)
Admission: EM | Admit: 2023-08-23 | Discharge: 2023-08-23 | Disposition: A | Discharge status: Home / Self Care | Payer: Worker's Compensation | Attending: Emergency Medicine | Admitting: Emergency Medicine

## 2023-08-23 ENCOUNTER — Encounter (HOSPITAL_COMMUNITY): Payer: Self-pay | Admitting: Emergency Medicine

## 2023-08-23 ENCOUNTER — Emergency Department (HOSPITAL_COMMUNITY): Payer: Managed Care, Other (non HMO)

## 2023-08-23 ENCOUNTER — Other Ambulatory Visit: Payer: Self-pay

## 2023-08-23 DIAGNOSIS — M546 Pain in thoracic spine: Secondary | ICD-10-CM | POA: Insufficient documentation

## 2023-08-23 DIAGNOSIS — J45909 Unspecified asthma, uncomplicated: Secondary | ICD-10-CM | POA: Diagnosis not present

## 2023-08-23 MED ORDER — METHOCARBAMOL 500 MG PO TABS
500.0000 mg | ORAL_TABLET | Freq: Once | ORAL | Status: AC
  Administered 2023-08-23: 500 mg via ORAL
  Filled 2023-08-23: qty 1

## 2023-08-23 NOTE — ED Triage Notes (Signed)
Patient was hit in the back by a 10x4x2 scaffolding. She has pain and abrasions on the neck and near the right shoulder blade. Patient does not take blood thinners. Patient had a back spasm with standing per EMS.

## 2023-08-23 NOTE — ED Provider Notes (Signed)
[facility] Provider Note   CSN: [id number] Arrival date & time: 08/23/23  1521     History  Chief Complaint  Patient presents with   Back Pain    [name] is a 52 y.o. female with a history of rheumatoid arthritis, asthma, and GERD who presents the ED today for back pain.  Patient reports that she was helping build tiny homes at [facility] when as scaffolding fell and hit her between the shoulder blades.  She reports pain and an abrasion between her shoulder blades.  Denies LOC, weakness, or neck pain.  Additionally, she reports having a muscle spasm at her lower back.  No additional complaints or concerns at this time.    Home Medications Prior to Admission medications   Medication Sig Start Date End Date Taking? Authorizing Provider  albuterol (PROVENTIL HFA;VENTOLIN HFA) 108 (90 BASE) MCG/ACT inhaler Inhale 2 puffs into the lungs every 6 (six) hours as needed for wheezing. 07/18/13   Douglass Rivers, MD  amphetamine-dextroamphetamine (ADDERALL XR) 10 MG 24 hr capsule 1-2 capsules in the morning 10/11/20   [provider]  benzonatate (TESSALON) 100 MG capsule Take 1 capsule (100 mg total) by mouth 2 (two) times daily as needed for cough. 11/01/22   Crain, Whitney L, PA  cetirizine (ZYRTEC) 10 MG tablet Take 10 mg by mouth daily.    [provider]  doxycycline (VIBRA-TABS) 100 MG tablet Take 1 tablet (100 mg total) by mouth 2 (two) times daily. 11/27/22   Waldon Merl, PA-C  estradiol (ESTRACE) 1 MG tablet Take 1 tablet by mouth daily.    [provider]  fluticasone (FLONASE) 50 MCG/ACT nasal spray Place 2 sprays into the nose daily.    [provider]  folic acid (FOLVITE) 1 MG tablet Take 1 mg by mouth daily.    [provider]  hydroxychloroquine (PLAQUENIL) 200 MG tablet Take 200 mg by mouth 2 (two) times daily. 03/05/21   [provider]  ipratropium  (ATROVENT) 0.03 % nasal spray Place 2 sprays into both nostrils every 12 (twelve) hours. 11/01/22   Maretta Bees, PA  LORazepam (ATIVAN) 0.5 MG tablet  11/04/20   [provider]  methotrexate (RHEUMATREX) 2.5 MG tablet  06/15/14   [provider]  traMADol (ULTRAM) 50 MG tablet Take by mouth. 10/10/20   [provider]      Allergies    Sulfa antibiotics    Review of Systems   Review of Systems  Musculoskeletal:  Positive for back pain.  All other systems reviewed and are negative.   Physical Exam Updated Vital Signs BP 130/72   Pulse 77   Temp 98.2 F (36.8 C) (Oral)   Resp 18   LMP 07/01/2013   SpO2 100%  Physical Exam Vitals and nursing note reviewed.  Constitutional:      General: She is not in acute distress.    Appearance: Normal appearance.  HENT:     Head: Normocephalic and atraumatic.     Mouth/Throat:     Mouth: Mucous membranes are moist.  Eyes:     Conjunctiva/sclera: Conjunctivae normal.     Pupils: Pupils are equal, round, and reactive to light.  Cardiovascular:     Rate and Rhythm: Normal rate and regular rhythm.     Pulses: Normal pulses.     Heart sounds: Normal heart sounds.  Pulmonary:     Effort: Pulmonary effort is normal.  Breath sounds: Normal breath sounds.  Abdominal:     Palpations: Abdomen is soft.     Tenderness: There is no abdominal tenderness.  Musculoskeletal:        General: Normal range of motion.     Comments: Midline tenderness to palpation of the thoracic vertebrae without step-off or deformity. No cervical or lumbar tenderness. ROM, strength, and sensation of upper and lower extremities intact bilaterally.  Skin:    General: Skin is warm and dry.     Findings: No rash.     Comments: Abrasion present between her scapulas  Neurological:     General: No focal deficit present.     Mental Status: She is alert.     Sensory: No sensory deficit.     Motor: No weakness.  Psychiatric:        Mood and  Affect: Mood normal.        Behavior: Behavior normal.    ED Results / Procedures / Treatments   Labs (all labs ordered are listed, but only abnormal results are displayed) Labs Reviewed - No data to display  EKG None  Radiology DG Thoracic Spine 2 View  Result Date: 08/23/2023 CLINICAL DATA:  Back injury, pain EXAM: THORACIC SPINE 2 VIEWS COMPARISON:  None Available. FINDINGS: There is no evidence of thoracic spine fracture. Alignment is normal. No other significant bone abnormalities are identified. IMPRESSION: Negative. Electronically Signed   By: Charlett Nose M.D.   On: 08/23/2023 19:41    Procedures Procedures: not indicated.   Medications Ordered in ED Medications  methocarbamol (ROBAXIN) tablet 500 mg (500 mg Oral Given 08/23/23 1656)    ED Course/ Medical Decision Making/ A&P                                 Medical Decision Making Amount and/or Complexity of Data Reviewed Radiology: ordered.  Risk Prescription drug management.   This patient presents to the ED for concern of back pain, this involves an extensive number of treatment options, and is a complaint that carries with it a high risk of complications and morbidity.   Differential diagnosis includes: fracture, misalignment, stenosis, radiculopathy, contusion, muscle spasm, etc.   Comorbidities  See HPI above   Additional History  Additional history obtained from prior records.   Imaging Studies  I ordered imaging studies including thoracic spine x-ray  I independently visualized and interpreted imaging which showed: No evidence of thoracic spine fracture.  Alignment is normal. I agree with the radiologist interpretation   Problem List / ED Course / Critical Interventions / Medication Management  Upper back pain I ordered medications including: Robaxin for pain  Reevaluation of the patient after these medicines showed that the patient improved I have reviewed the patients home medicines  and have made adjustments as needed   Social Determinants of Health  Occupation   Test / Admission - Considered  Discussed findings with patient.  All questions were answered. She is hemodynamically stable and safe for discharge home. Return precautions given.       Final Clinical Impression(s) / ED Diagnoses Final diagnoses:  Acute midline thoracic back pain    Rx / DC Orders ED Discharge Orders     None         Maxwell Marion, PA-C 08/23/23 2259    Jacalyn Lefevre, MD 08/23/23 2300

## 2023-08-23 NOTE — Discharge Instructions (Signed)
As discussed, your imaging was reassuring.  You can alternate between ibuprofen and Tylenol every 4 hours as needed for pain.  Follow-up with your PCP in 5 to 7 days for reevaluation.  Get help right away if: You develop new bowel or bladder control problems. You have unusual weakness or numbness in your arms or legs. You feel faint.

## 2023-09-13 ENCOUNTER — Telehealth: Payer: Managed Care, Other (non HMO) | Admitting: Family Medicine

## 2023-09-13 ENCOUNTER — Encounter: Payer: Self-pay | Admitting: Family Medicine

## 2023-09-13 DIAGNOSIS — B9689 Other specified bacterial agents as the cause of diseases classified elsewhere: Secondary | ICD-10-CM

## 2023-09-13 DIAGNOSIS — Z2089 Contact with and (suspected) exposure to other communicable diseases: Secondary | ICD-10-CM

## 2023-09-13 DIAGNOSIS — J019 Acute sinusitis, unspecified: Secondary | ICD-10-CM | POA: Diagnosis not present

## 2023-09-13 DIAGNOSIS — B379 Candidiasis, unspecified: Secondary | ICD-10-CM | POA: Diagnosis not present

## 2023-09-13 DIAGNOSIS — T3695XA Adverse effect of unspecified systemic antibiotic, initial encounter: Secondary | ICD-10-CM

## 2023-09-13 MED ORDER — DOXYCYCLINE HYCLATE 100 MG PO TABS: 100.0000 mg | ORAL_TABLET | Freq: Two times a day (BID) | ORAL | 0 refills | Status: AC

## 2023-09-13 MED ORDER — FLUCONAZOLE 150 MG PO TABS: 150.0000 mg | ORAL_TABLET | ORAL | 0 refills | Status: AC

## 2023-09-13 NOTE — Progress Notes (Signed)
Virtual Visit Consent   [name], you are scheduled for a virtual visit with a  provider today. Just as with appointments in the office, your consent must be obtained to participate. Your consent will be active for this visit and any virtual visit you may have with one of our providers in the next 365 days. If you have a MyChart account, a copy of this consent can be sent to you electronically.  As this is a virtual visit, video technology does not allow for your provider to perform a traditional examination. This may limit your provider's ability to fully assess your condition. If your provider identifies any concerns that need to be evaluated in person or the need to arrange testing (such as labs, EKG, etc.), we will make arrangements to do so. Although advances in technology are sophisticated, we cannot ensure that it will always work on either your end or our end. If the connection with a video visit is poor, the visit may have to be switched to a telephone visit. With either a video or telephone visit, we are not always able to ensure that we have a secure connection.  By engaging in this virtual visit, you consent to the provision of healthcare and authorize for your insurance to be billed (if applicable) for the services provided during this visit. Depending on your insurance coverage, you may receive a charge related to this service.  I need to obtain your verbal consent now. Are you willing to proceed with your visit today? [name] has provided verbal consent on 09/13/2023 for a virtual visit (video or telephone). Freddy Finner, NP  Date: 09/13/2023 10:55 AM  Virtual Visit via Video Note   I, Freddy Finner, connected with  [name]  ([id number], 1971-08-29) on 09/13/23 at 12:45 PM EST by a video-enabled telemedicine application and verified that I am speaking with the correct person using two identifiers.  Location: Patient: Virtual Visit Location  Patient: Home Provider: Virtual Visit Location Provider: Home Office   I discussed the limitations of evaluation and management by telemedicine and the availability of in person appointments. The patient expressed understanding and agreed to proceed.    History of Present Illness: [name] is a 52 y.o. 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  COVID test: no recent testing, but had it at start of COVID  Vaccines: needs to get booster, Flu seasonal completed    Problems:  Patient Active Problem List   Diagnosis Date Noted   Rheumatoid arthritis (HCC)     Allergies:  Allergies  Allergen Reactions   Sulfa Antibiotics     Insomnia and headaches   Medications:  Current Outpatient Medications:    albuterol (PROVENTIL HFA;VENTOLIN HFA) 108 (90 BASE) MCG/ACT inhaler, Inhale 2 puffs into the lungs every 6 (six) hours as needed for wheezing., Disp: 1 Inhaler, Rfl: 0   amphetamine-dextroamphetamine (ADDERALL XR) 10 MG 24 hr capsule, 1-2 capsules in  the morning, Disp: , Rfl:    benzonatate (TESSALON) 100 MG capsule, Take 1 capsule (100 mg total) by mouth 2 (two) times daily as needed for cough., Disp: 20 capsule, Rfl: 0   cetirizine (ZYRTEC) 10 MG tablet, Take 10 mg by mouth daily., Disp: , Rfl:    doxycycline (VIBRA-TABS) 100 MG tablet, Take 1 tablet (100 mg total) by mouth 2 (two) times daily., Disp: 20 tablet, Rfl: 0   estradiol (ESTRACE) 1 MG tablet, Take 1 tablet by  mouth daily., Disp: , Rfl:    fluticasone (FLONASE) 50 MCG/ACT nasal spray, Place 2 sprays into the nose daily., Disp: , Rfl:    folic acid (FOLVITE) 1 MG tablet, Take 1 mg by mouth daily., Disp: , Rfl:    hydroxychloroquine (PLAQUENIL) 200 MG tablet, Take 200 mg by mouth 2 (two) times daily., Disp: , Rfl:    ipratropium (ATROVENT) 0.03 % nasal spray, Place 2 sprays into both nostrils every 12 (twelve) hours., Disp: 30 mL, Rfl: 0   LORazepam (ATIVAN) 0.5 MG tablet, , Disp: , Rfl:    methotrexate (RHEUMATREX) 2.5 MG tablet, , Disp: , Rfl:    traMADol (ULTRAM) 50 MG tablet, Take by mouth., Disp: , Rfl:   Observations/Objective: Patient is well-developed, well-nourished in no acute distress.  Resting comfortably  at home.  Head is normocephalic, atraumatic.  No labored breathing.  Speech is clear and coherent with logical content.  Patient is alert and oriented at baseline.  Cough  Assessment and Plan:  1. Acute bacterial sinusitis  - doxycycline (VIBRA-TABS) 100 MG tablet; Take 1 tablet (100 mg total) by mouth 2 (two) times daily for 7 days.  Dispense: 14 tablet; Refill: 0  2. Antibiotic-induced yeast infection  - fluconazole (DIFLUCAN) 150 MG tablet; Take 1 tablet (150 mg total) by mouth as directed. Repeat in 3 days as needed  Dispense: 1 tablet; Refill: 0  3. Exposure to pneumonia  Considered two ABX, but opted to start with Doxy given her time to see if another is needed  - Take meds as prescribed - Rest voice - Use a cool mist humidifier especially during the winter months when heat dries out the air. - Use saline nose sprays frequently to help soothe nasal passages if they are drying out. - Stay hydrated by drinking plenty of fluids - Keep thermostat turn down low to prevent drying out which can cause a dry cough. - For any cough or congestion- robitussin DM or Delsym as needed - For fever or aches or pains- take tylenol or ibuprofen as directed on bottle             * for  fevers greater than 101 orally you may alternate ibuprofen and tylenol every 3 hours.  If you do not improve you will need a follow up visit in person.                Reviewed side effects, risks and benefits of medication.    Patient acknowledged agreement and understanding of the plan.   Past Medical, Surgical, Social History, Allergies, and Medications have been Reviewed.    Follow Up Instructions: I discussed the assessment and treatment plan with the patient. The patient was provided an opportunity to ask questions and all were answered. The patient agreed with the plan and demonstrated an understanding of the instructions.  A copy of instructions were sent to the patient via MyChart unless otherwise noted below.    The patient was advised to  call back or seek an in-person evaluation if the symptoms worsen or if the condition fails to improve as anticipated.    Freddy Finner, NP

## 2023-09-13 NOTE — Patient Instructions (Signed)
[name], thank you for joining Freddy Finner, NP for today's virtual visit.  While this provider is not your primary care provider (PCP), if your PCP is located in our provider database this encounter information will be shared with them immediately following your visit.   A [facility] MyChart account gives you access to today's visit and all your visits, tests, and labs performed at [facility] " click here if you don't have a [facility] MyChart account or go to [email or web address]  Consent: (Patient) [name] provided verbal consent for this virtual visit at the beginning of the encounter.  Current Medications:  Current Outpatient Medications:    doxycycline (VIBRA-TABS) 100 MG tablet, Take 1 tablet (100 mg total) by mouth 2 (two) times daily for 7 days., Disp: 14 tablet, Rfl: 0   fluconazole (DIFLUCAN) 150 MG tablet, Take 1 tablet (150 mg total) by mouth as directed. Repeat in 3 days as needed, Disp: 1 tablet, Rfl: 0   albuterol (PROVENTIL HFA;VENTOLIN HFA) 108 (90 BASE) MCG/ACT inhaler, Inhale 2 puffs into the lungs every 6 (six) hours as needed for wheezing., Disp: 1 Inhaler, Rfl: 0   amphetamine-dextroamphetamine (ADDERALL XR) 10 MG 24 hr capsule, 1-2 capsules in the morning, Disp: , Rfl:    benzonatate (TESSALON) 100 MG capsule, Take 1 capsule (100 mg total) by mouth 2 (two) times daily as needed for cough., Disp: 20 capsule, Rfl: 0   cetirizine (ZYRTEC) 10 MG tablet, Take 10 mg by mouth daily., Disp: , Rfl:    estradiol (ESTRACE) 1 MG tablet, Take 1 tablet by mouth daily., Disp: , Rfl:    fluticasone (FLONASE) 50 MCG/ACT nasal spray, Place 2 sprays into the nose daily., Disp: , Rfl:    folic acid (FOLVITE) 1 MG tablet, Take 1 mg by mouth daily., Disp: , Rfl:    hydroxychloroquine (PLAQUENIL) 200 MG tablet, Take 200 mg by mouth 2 (two) times daily., Disp: , Rfl:    ipratropium (ATROVENT) 0.03 % nasal spray, Place 2 sprays into both nostrils every  12 (twelve) hours., Disp: 30 mL, Rfl: 0   LORazepam (ATIVAN) 0.5 MG tablet, , Disp: , Rfl:    methotrexate (RHEUMATREX) 2.5 MG tablet, , Disp: , Rfl:    traMADol (ULTRAM) 50 MG tablet, Take by mouth., Disp: , Rfl:    Medications ordered in this encounter:  Meds ordered this encounter  Medications   doxycycline (VIBRA-TABS) 100 MG tablet    Sig: Take 1 tablet (100 mg total) by mouth 2 (two) times daily for 7 days.    Dispense:  14 tablet    Refill:  0    Order Specific Question:   Supervising Provider    Answer:   Merrilee Jansky [[id number]]   fluconazole (DIFLUCAN) 150 MG tablet    Sig: Take 1 tablet (150 mg total) by mouth as directed. Repeat in 3 days as needed    Dispense:  1 tablet    Refill:  0    Order Specific Question:   Supervising Provider    Answer:   Merrilee Jansky [[id number]]     *If you need refills on other medications prior to your next appointment, please contact your pharmacy*  Follow-Up: Call back or seek an in-person evaluation if the symptoms worsen or if the condition fails to improve as anticipated.  [facility] [phone]  Other Instructions  URI recommendations: - Increased rest - Increasing Fluids - Acetaminophen / ibuprofen as needed for  fever/pain.  - Salt water gargling, chloraseptic spray and throat lozenges - Mucinex if mucus is present and increasing.  - Saline nasal spray if congestion or if nasal passages feel dry. - Humidifying the air.     If you have been instructed to have an in-person evaluation today at a local Urgent Care facility, please use the link below. It will take you to a list of all of our available [facility], including address, phone number and hours of operation. Please do not delay care.  [facility]  If you or a family member do not have a primary care provider, use the link below to schedule a visit and establish care. When you choose a [facility] primary care physician  or advanced practice provider, you gain a long-term partner in health. Find a Primary Care Provider  Learn more about [facility] in-office and virtual care options: [facility] - Get Care Now
